# Patient Record
Sex: Female | Born: 1995 | Hispanic: Yes | Marital: Single | Smoking: Never smoker
Health system: Southern US, Community
[De-identification: ages and names within clinical notes are randomized; demographics above are authoritative.]

## PROBLEM LIST (undated history)

## (undated) ENCOUNTER — Inpatient Hospital Stay (HOSPITAL_COMMUNITY): Payer: Self-pay

## (undated) DIAGNOSIS — Z789 Other specified health status: Secondary | ICD-10-CM

## (undated) HISTORY — PX: NO PAST SURGERIES: SHX2092

## (undated) HISTORY — DX: Other specified health status: Z78.9

---

## 2014-01-27 NOTE — L&D Delivery Note (Signed)
   Delivery Note At 9:05 PM a viable and healthy female was delivered via Vaginal, Spontaneous Delivery (Presentation:Right Occiput Anterior ).  APGAR:8 ,9 ; weight pending  .   Placenta status: Intact, Spontaneous.  Cord: 3 vessels with the following complications: none.  Cord pH: N/A  Anesthesia:  None Episiotomy:  None Lacerations:  1st degree peritoneal ---hemostatic and not requiring repair Suture Repair: none Est. Blood Loss (mL):  Refer to delivery summary   Mom to postpartum.  Baby to Couplet care / Skin to Skin.  De Hollingshead 09/09/2014, 9:21 PM

## 2014-03-05 ENCOUNTER — Inpatient Hospital Stay (HOSPITAL_COMMUNITY): Payer: Medicaid Other

## 2014-03-05 ENCOUNTER — Encounter (HOSPITAL_COMMUNITY): Payer: Self-pay | Admitting: *Deleted

## 2014-03-05 ENCOUNTER — Inpatient Hospital Stay (HOSPITAL_COMMUNITY)
Admission: AD | Admit: 2014-03-05 | Discharge: 2014-03-06 | Disposition: A | Discharge status: Home / Self Care | Payer: Medicaid Other | Source: Ambulatory Visit | Attending: Family Medicine | Admitting: Family Medicine

## 2014-03-05 DIAGNOSIS — R1031 Right lower quadrant pain: Secondary | ICD-10-CM | POA: Diagnosis present

## 2014-03-05 DIAGNOSIS — O0931 Supervision of pregnancy with insufficient antenatal care, first trimester: Secondary | ICD-10-CM | POA: Diagnosis not present

## 2014-03-05 DIAGNOSIS — R102 Pelvic and perineal pain: Secondary | ICD-10-CM

## 2014-03-05 DIAGNOSIS — O9989 Other specified diseases and conditions complicating pregnancy, childbirth and the puerperium: Secondary | ICD-10-CM | POA: Diagnosis not present

## 2014-03-05 DIAGNOSIS — Z3A11 11 weeks gestation of pregnancy: Secondary | ICD-10-CM | POA: Diagnosis not present

## 2014-03-05 DIAGNOSIS — O26899 Other specified pregnancy related conditions, unspecified trimester: Secondary | ICD-10-CM

## 2014-03-05 DIAGNOSIS — N949 Unspecified condition associated with female genital organs and menstrual cycle: Secondary | ICD-10-CM | POA: Insufficient documentation

## 2014-03-05 LAB — URINALYSIS, ROUTINE W REFLEX MICROSCOPIC
Bilirubin Urine: NEGATIVE
Glucose, UA: NEGATIVE mg/dL
Hgb urine dipstick: NEGATIVE
Ketones, ur: NEGATIVE mg/dL
Leukocytes, UA: NEGATIVE
Nitrite: NEGATIVE
PROTEIN: NEGATIVE mg/dL
Specific Gravity, Urine: 1.015 (ref 1.005–1.030)
Urobilinogen, UA: 0.2 mg/dL (ref 0.0–1.0)
pH: 7.5 (ref 5.0–8.0)

## 2014-03-05 LAB — POCT PREGNANCY, URINE: Preg Test, Ur: POSITIVE — AB

## 2014-03-05 LAB — CBC
HCT: 33 % — ABNORMAL LOW (ref 36.0–46.0)
Hemoglobin: 11.4 g/dL — ABNORMAL LOW (ref 12.0–15.0)
MCH: 32.3 pg (ref 26.0–34.0)
MCHC: 34.5 g/dL (ref 30.0–36.0)
MCV: 93.5 fL (ref 78.0–100.0)
Platelets: 186 10*3/uL (ref 150–400)
RBC: 3.53 MIL/uL — ABNORMAL LOW (ref 3.87–5.11)
RDW: 13.3 % (ref 11.5–15.5)
WBC: 9.8 10*3/uL (ref 4.0–10.5)

## 2014-03-05 LAB — OB RESULTS CONSOLE GC/CHLAMYDIA: Gonorrhea: NEGATIVE

## 2014-03-05 NOTE — MAU Provider Note (Signed)
Chief Complaint: No chief complaint on file.  First Provider Initiated Contact with Patient 03/05/14 2257      SUBJECTIVE HPI: Gloria LimboKaren Nunez is a 19 y.o. G2P1 at 5375w1d by LMP who presents with intermittent low abdominal pain, right greater than left times a few days. 5/10 on pain scale. No aggravating or alleviating factors. Also reports mild dysuria. Has not had any prenatal care, ultrasounds or blood work this pregnancy. Does not know where she will get prenatal care.   History reviewed. No pertinent past medical history. OB History  Gravida Para Term Preterm AB SAB TAB Ectopic Multiple Living  2 1        1     # Outcome Date GA Lbr Len/2nd Weight Sex Delivery Anes PTL Lv  2 Current           1 Para              History reviewed. No pertinent past surgical history. History   Social History  . Marital Status: Single    Spouse Name: N/A    Number of Children: N/A  . Years of Education: N/A   Occupational History  . Not on file.   Social History Main Topics  . Smoking status: Never Smoker   . Smokeless tobacco: Never Used  . Alcohol Use: No  . Drug Use: No  . Sexual Activity: Yes    Birth Control/ Protection: None   Other Topics Concern  . Not on file   Social History Narrative  . No narrative on file   No current facility-administered medications on file prior to encounter.   No current outpatient prescriptions on file prior to encounter.   No Known Allergies  ROS: Pertinent positive items in HPI. Negative for fever, chills, vaginal bleeding, vaginal discharge, hematuria, urgency, frequency, flank pain, nausea, vomiting, diarrhea, constipation. Last bowel movement yesterday.  OBJECTIVE Blood pressure 108/62, pulse 89, temperature 98.7 F (37.1 C), temperature source Oral, resp. rate 18, height 5\' 2"  (1.575 m), weight 47.628 kg (105 lb), last menstrual period 12/17/2013, SpO2 100 %. GENERAL: Well-developed, well-nourished female in no acute distress.   HEENT: Normocephalic HEART: normal rate RESP: normal effort ABDOMEN: Moderately distended, mild suprapubic and right groin tenderness. Negative rebound tenderness or guarding. Positive bowel sounds 4. Mild, questionable CVA tenderness on the right. No CVA tenderness on left. EXTREMITIES: Nontender, no edema NEURO: Alert and oriented SPECULUM EXAM: NEFG, moderate amount of thin, white, malodorous discharge, no blood noted, cervix clean BIMANUAL: cervix closed; uterus 10-11 week, no adnexal tenderness or masses. No cervical motion tenderness.  LAB RESULTS Results for orders placed or performed during the hospital encounter of 03/05/14 (from the past 24 hour(s))  Urinalysis, Routine w reflex microscopic     Status: None   Collection Time: 03/05/14  9:45 PM  Result Value Ref Range   Color, Urine YELLOW YELLOW   APPearance CLEAR CLEAR   Specific Gravity, Urine 1.015 1.005 - 1.030   pH 7.5 5.0 - 8.0   Glucose, UA NEGATIVE NEGATIVE mg/dL   Hgb urine dipstick NEGATIVE NEGATIVE   Bilirubin Urine NEGATIVE NEGATIVE   Ketones, ur NEGATIVE NEGATIVE mg/dL   Protein, ur NEGATIVE NEGATIVE mg/dL   Urobilinogen, UA 0.2 0.0 - 1.0 mg/dL   Nitrite NEGATIVE NEGATIVE   Leukocytes, UA NEGATIVE NEGATIVE  Pregnancy, urine POC     Status: Abnormal   Collection Time: 03/05/14 10:01 PM  Result Value Ref Range   Preg Test, Ur POSITIVE (A) NEGATIVE  CBC  Status: Abnormal   Collection Time: 03/05/14 11:03 PM  Result Value Ref Range   WBC 9.8 4.0 - 10.5 K/uL   RBC 3.53 (L) 3.87 - 5.11 MIL/uL   Hemoglobin 11.4 (L) 12.0 - 15.0 g/dL   HCT 16.1 (L) 09.6 - 04.5 %   MCV 93.5 78.0 - 100.0 fL   MCH 32.3 26.0 - 34.0 pg   MCHC 34.5 30.0 - 36.0 g/dL   RDW 40.9 81.1 - 91.4 %   Platelets 186 150 - 400 K/uL  Wet prep, genital     Status: Abnormal   Collection Time: 03/06/14 12:30 AM  Result Value Ref Range   Yeast Wet Prep HPF POC NONE SEEN NONE SEEN   Trich, Wet Prep NONE SEEN NONE SEEN   Clue Cells Wet Prep  HPF POC NONE SEEN NONE SEEN   WBC, Wet Prep HPF POC MODERATE (A) NONE SEEN    IMAGING US Ob Comp Less 14 Wks  03/06/2014   CLINICAL DATA:  Right lower quadrant pain since 03/04/2014. Estimated gestational age by LMP is 11 weeks 1 day. Quantitative beta HCG was not ordered.  EXAM: OBSTETRIC <14 WK ULTRASOUND  TECHNIQUE: Transabdominal ultrasound was performed for evaluation of the gestation as well as the maternal uterus and adnexal regions.  COMPARISON:  None.  FINDINGS: Intrauterine gestational sac: A single intrauterine pregnancy is identified.  Yolk sac:  Yolk sac is not present, consistent with gestational age.  Embryo:  Fetal pole is identified.  Cardiac Activity: Fetal cardiac activity is observed.  Heart Rate: 171 bpm  CRL:   46  mm   11 w 3 d                  [place] EDC: 09/21/2014  Maternal uterus/adnexae: No myometrial mass lesions. No subchorionic hemorrhage. Both ovaries are identified and appear normal. No free pelvic fluid collections.  IMPRESSION: Single living intrauterine pregnancy. Estimated gestational age by crown-rump length is 11 weeks 3 days. No acute complication is suggested.   Electronically Signed   By: Burman Nieves M.D.   On: 03/06/2014 00:04    MAU COURSE Low suspicion for appendicitis due to location of pain and absence of fever, leukocytosis and GI complaints.   ASSESSMENT 1. Pain of round ligament affecting pregnancy, antepartum   2. RLQ abdominal pain    PLAN Discharge home in stable condition. Comfort measures. GC/Chlamydia cultures pending.  list of providers given. Pregnancy verification letter given   Medication List    TAKE these medications        ibuprofen 600 MG tablet  Commonly known as:  ADVIL,MOTRIN  Take 1 tablet (600 mg total) by mouth every 6 (six) hours as needed for moderate pain. Do not use after [redacted] weeks gestation.     prenatal multivitamin Tabs tablet  Take 1 tablet by mouth daily at 12 noon.       [place], CNM 03/06/2014   1:04 AM

## 2014-03-05 NOTE — MAU Note (Signed)
Pt reports she is [redacted] weeks pregnant. Having lower abd pain. Dysuria at times. Denies bleeding.

## 2014-03-05 NOTE — Progress Notes (Signed)
Assisted admissions with interpretation of registering patient.  Spanish Interpreter

## 2014-03-05 NOTE — Progress Notes (Signed)
Assisted RN with interpretation of patient assessment. Also assisted Pharmacy Tech with interpretation of medication information.  Spanish Interpreter

## 2014-03-05 NOTE — Progress Notes (Signed)
Assisted RN with interpretation of triage patient information.  Spanish Interpreter

## 2014-03-06 DIAGNOSIS — Z3A11 11 weeks gestation of pregnancy: Secondary | ICD-10-CM

## 2014-03-06 DIAGNOSIS — N949 Unspecified condition associated with female genital organs and menstrual cycle: Secondary | ICD-10-CM

## 2014-03-06 DIAGNOSIS — O9989 Other specified diseases and conditions complicating pregnancy, childbirth and the puerperium: Secondary | ICD-10-CM

## 2014-03-06 LAB — WET PREP, GENITAL
CLUE CELLS WET PREP: NONE SEEN
Trich, Wet Prep: NONE SEEN
YEAST WET PREP: NONE SEEN

## 2014-03-06 LAB — GC/CHLAMYDIA PROBE AMP (~~LOC~~) NOT AT ARMC
CHLAMYDIA, DNA PROBE: NEGATIVE
Neisseria Gonorrhea: NEGATIVE

## 2014-03-06 MED ORDER — IBUPROFEN 600 MG PO TABS: 600.0000 mg | ORAL_TABLET | Freq: Four times a day (QID) | ORAL | Status: DC | PRN

## 2014-03-06 NOTE — Discharge Instructions (Signed)
Dolor abdominal en el embarazo °(Abdominal Pain During Pregnancy) °El dolor abdominal es frecuente durante el embarazo. Generalmente no causa ningún daño. El dolor abdominal puede tener numerosas causas. Algunas causas son más graves que otras. Ciertas causas de dolor abdominal durante el embarazo se diagnostican fácilmente. A veces, se tarda un tiempo para llegar al diagnóstico. Otras veces la causa no se conoce. El dolor abdominal puede estar relacionado con alguna alteración del embarazo, o puede deberse a una causa totalmente diferente. Por este motivo, siempre consulte a su médico cuando sienta molestias abdominales. °INSTRUCCIONES PARA EL CUIDADO EN EL HOGAR  °Esté atenta al dolor para ver si hay cambios. Las siguientes indicaciones ayudarán a aliviar cualquier molestia que pueda sentir: °· No tenga relaciones sexuales y no coloque nada dentro de la vagina hasta que los síntomas hayan desaparecido completamente. °· Descanse todo lo que pueda hasta que el dolor se le haya calmado. °· Si siente náuseas, beba líquidos claros. Evite los alimentos sólidos mientras sienta malestar o tenga náuseas. °· Tome sólo medicamentos de venta libre o recetados, según las indicaciones del médico. °· Cumpla con todas las visitas de control, según le indique su médico. °SOLICITE ATENCIÓN MÉDICA DE INMEDIATO SI: °· Tiene un sangrado, pérdida de líquidos o elimina tejidos por la vagina. °· El dolor o los cólicos aumentan. °· Tiene vómitos persistentes. °· Comienza a sentir dolor al orinar u observa sangre. °· Tiene fiebre. °· Nota que los movimientos del bebé disminuyen. °· Siente intensa debilidad o se marea. °· Tiene dificultad para respirar con o sin dolor abdominal. °· Siente un dolor de cabeza intenso junto al dolor abdominal. °· Tiene una secreción vaginal anormal con dolor abdominal. °· Tiene diarrea persistente. °· El dolor abdominal sigue o empeora aún después de hacer reposo. °ASEGÚRESE DE QUE:  °· Comprende estas  instrucciones. °· Controlará su afección. °· Recibirá ayuda de inmediato si no mejora o si empeora. °Document Released: 01/13/2005 Document Revised: 11/03/2012 °ExitCare® Patient Information ©2015 [employer]. This information is not intended to replace advice given to you by your health care provider. Make sure you discuss any questions you have with your health care provider. ° °

## 2014-03-31 ENCOUNTER — Ambulatory Visit (INDEPENDENT_AMBULATORY_CARE_PROVIDER_SITE_OTHER): Payer: Medicaid Other | Admitting: Physician Assistant

## 2014-03-31 ENCOUNTER — Encounter: Payer: Self-pay | Admitting: Physician Assistant

## 2014-03-31 VITALS — BP 100/50 | HR 88 | Wt 103.9 lb

## 2014-03-31 DIAGNOSIS — Z789 Other specified health status: Secondary | ICD-10-CM

## 2014-03-31 DIAGNOSIS — Z3482 Encounter for supervision of other normal pregnancy, second trimester: Secondary | ICD-10-CM

## 2014-03-31 DIAGNOSIS — Z3492 Encounter for supervision of normal pregnancy, unspecified, second trimester: Secondary | ICD-10-CM

## 2014-03-31 LAB — POCT URINALYSIS DIP (DEVICE)
Bilirubin Urine: NEGATIVE
Glucose, UA: NEGATIVE mg/dL
HGB URINE DIPSTICK: NEGATIVE
Ketones, ur: NEGATIVE mg/dL
Nitrite: NEGATIVE
PH: 6 (ref 5.0–8.0)
Protein, ur: NEGATIVE mg/dL
Specific Gravity, Urine: 1.025 (ref 1.005–1.030)
Urobilinogen, UA: 1 mg/dL (ref 0.0–1.0)

## 2014-03-31 NOTE — Patient Instructions (Signed)
Segundo trimestre de embarazo (Second Trimester of Pregnancy) El segundo trimestre va desde la semana13 hasta la 28, desde el cuarto hasta el sexto mes, y suele ser el momento en el que mejor se siente. Su organismo se ha adaptado a estar embarazada y comienza a sentirse fsicamente mejor. En general, las nuseas matutinas han disminuido o han desaparecido completamente, p El segundo trimestre es tambin la poca en la que el feto se desarrolla rpidamente. Hacia el final del sexto mes, el feto mide aproximadamente 9pulgadas (23cm) y pesa alrededor de 1 libras (700g). Es probable que sienta que el beb se mueve (da pataditas) entre las 18 y 20semanas del embarazo. CAMBIOS EN EL ORGANISMO Su organismo atraviesa por muchos cambios durante el embarazo, y estos varan de una mujer a otra.   Seguir aumentando de peso. Notar que la parte baja del abdomen sobresale.  Podrn aparecer las primeras estras en las caderas, el abdomen y las mamas.  Es posible que tenga dolores de cabeza que pueden aliviarse con los medicamentos que su mdico autorice.  Tal vez tenga necesidad de orinar con ms frecuencia porque el feto est ejerciendo presin sobre la vejiga.  Debido al embarazo podr sentir acidez estomacal con frecuencia.  Puede estar estreida, ya que ciertas hormonas enlentecen los movimientos de los msculos que empujan los desechos a travs de los intestinos.  Pueden aparecer hemorroides o abultarse e hincharse las venas (venas varicosas).  Puede tener dolor de espalda que se debe al aumento de peso y a que las hormonas del embarazo relajan las articulaciones entre los huesos de la pelvis, y como consecuencia de la modificacin del peso y los msculos que mantienen el equilibrio.  Las mamas seguirn creciendo y le dolern.  Las encas pueden sangrar y estar sensibles al cepillado y al hilo dental.  Pueden aparecer zonas oscuras o manchas (cloasma, mscara del embarazo) en el rostro que  probablemente se atenuarn despus del nacimiento del beb.  Es posible que se forme una lnea oscura desde el ombligo hasta la zona del pubis (linea nigra) que probablemente se atenuarn despus del nacimiento del beb.  Tal vez haya cambios en el cabello que pueden incluir su engrosamiento, crecimiento rpido y cambios en la textura. Adems, a algunas mujeres se les cae el cabello durante o despus del embarazo, o tienen el cabello seco o fino. Lo ms probable es que el cabello se le normalice despus del nacimiento del beb. QU DEBE ESPERAR EN LAS CONSULTAS PRENATALES Durante una visita prenatal de rutina:  La pesarn para asegurarse de que usted y el feto estn creciendo normalmente.  Le tomarn la presin arterial.  Le medirn el abdomen para controlar el desarrollo del beb.  Se escucharn los latidos cardacos fetales.  Se evaluarn los resultados de los estudios solicitados en visitas anteriores. El mdico puede preguntarle lo siguiente:  Cmo se siente.  Si siente los movimientos del beb.  Si ha tenido sntomas anormales, como prdida de lquido, sangrado, dolores de cabeza intensos o clicos abdominales.  Si tiene alguna pregunta. Otros estudios que podrn realizarse durante el segundo trimestre incluyen lo siguiente:  Anlisis de sangre para detectar:  Concentraciones de hierro bajas (anemia).  Diabetes gestacional (entre la semana 24 y la 28).  Anticuerpos Rh.  Anlisis de orina para detectar infecciones, diabetes o protenas en la orina.  Una ecografa para confirmar que el beb crece y se desarrolla correctamente.  Una amniocentesis para diagnosticar posibles problemas genticos.  Estudios del feto para descartar espina   bfida y sndrome de Down. INSTRUCCIONES PARA EL CUIDADO EN EL HOGAR   Evite fumar, consumir hierbas, beber alcohol y tomar frmacos que no le hayan recetado. Estas sustancias qumicas afectan la formacin y el desarrollo del beb.  Siga  las indicaciones del mdico en relacin con el uso de medicamentos. Durante el embarazo, hay medicamentos que son seguros de tomar y otros que no.  Haga actividad fsica solo en la forma indicada por el mdico. Sentir clicos uterinos es un buen signo para detener la actividad fsica.  Contine comiendo alimentos que sanos con regularidad.  Use un sostn que le brinde buen soporte si le duelen las mamas.  No se d baos de inmersin en agua caliente, baos turcos ni saunas.  Colquese el cinturn de seguridad cuando conduzca.  No coma carne cruda ni queso sin cocinar; evite el contacto con las bandejas sanitarias de los gatos y la tierra que estos animales usan. Estos elementos contienen grmenes que pueden causar defectos congnitos en el beb.  Tome las vitaminas prenatales.  Si est estreida, pruebe un laxante suave (si el mdico lo autoriza). Consuma ms alimentos ricos en fibra, como vegetales y frutas frescos y cereales integrales. Beba gran cantidad de lquido para mantener la orina de tono claro o color amarillo plido.  Dese baos de asiento con agua tibia para aliviar el dolor o las molestias causadas por las hemorroides. Use una crema para las hemorroides si el mdico la autoriza.  Si tiene venas varicosas, use medias de descanso. Eleve los pies durante 15minutos, 3 o 4veces por da. Limite la cantidad de sal en su dieta.  No levante objetos pesados, use zapatos de tacones bajos y mantenga una buena postura.  Descanse con las piernas elevadas si tiene calambres o dolor de cintura.  Visite a su dentista si an no lo ha hecho durante el embarazo. Use un cepillo de dientes blando para higienizarse los dientes y psese el hilo dental con suavidad.  Puede seguir manteniendo relaciones sexuales, a menos que el mdico le indique lo contrario.  Concurra a todas las visitas prenatales segn las indicaciones de su mdico. SOLICITE ATENCIN MDICA SI:   Tiene mareos.  Siente  clicos leves, presin en la pelvis o dolor persistente en el abdomen.  Tiene nuseas, vmitos o diarrea persistentes.  Tiene secrecin vaginal con mal olor.  Siente dolor al orinar. SOLICITE ATENCIN MDICA DE INMEDIATO SI:   Tiene fiebre.  Tiene una prdida de lquido por la vagina.  Tiene sangrado o pequeas prdidas vaginales.  Siente dolor intenso o clicos en el abdomen.  Sube o baja de peso rpidamente.  Tiene dificultad para respirar y siente dolor de pecho.  Sbitamente se le hinchan mucho el rostro, las manos, los tobillos, los pies o las piernas.  No ha sentido los movimientos del beb durante una hora.  Siente un dolor de cabeza intenso que no se alivia con medicamentos.  Hay cambios en la visin. Document Released: 10/23/2004 Document Revised: 01/18/2013 ExitCare Patient Information 2015 [employer]. This information is not intended to replace advice given to you by your health care provider. Make sure you discuss any questions you have with your health care provider.  

## 2014-03-31 NOTE — Progress Notes (Signed)
  Subjective:    Gloria Nunez is a [id number] being seen today for her first obstetrical visit.   Patient does intend to breast feed. Pregnancy history fully reviewed.  Patient reports no complaints.  Filed Vitals:   03/31/14 0848  BP: 100/50  Pulse: 88  Weight: 103 lb 14.4 oz (47.129 kg)    HISTORY: OB History  Gravida Para Term Preterm AB SAB TAB Ectopic Multiple Living  2 1 1       2     # Outcome Date GA Lbr Len/2nd Weight Sex Delivery Anes PTL Lv  2 Current           1 Term 12/03/12 5364w0d   M Vag-Spont   Y     Past Medical History  Diagnosis Date  . Medical history non-contributory    Past Surgical History  Procedure Laterality Date  . No past surgeries     Family History  Problem Relation Age of Onset  . Diabetes Maternal Grandfather   . Heart disease Maternal Grandfather      Exam    Uterus:  Fundal Height: 12 cm  Pelvic Exam:                               System:     Skin: normal coloration and turgor, no rashes    Neurologic: oriented, normal mood, grossly non-focal   Extremities: normal strength, tone, and muscle mass   HEENT extra ocular movement intact   Mouth/Teeth mucous membranes moist, pharynx normal without lesions    Neck supple   Cardiovascular: regular rate and rhythm   Respiratory:  appears well, vitals normal, no respiratory distress, acyanotic, normal RR, ear and throat exam is normal, neck free of mass or lymphadenopathy, chest clear, no wheezing, crepitations, rhonchi, normal symmetric air entry   Abdomen: soft, non-tender; bowel sounds normal; no masses,  no organomegaly   Urinary:       Assessment:    Pregnancy: [id number] Patient Active Problem List   Diagnosis Date Noted  . Encounter for supervision of other normal pregnancy in second trimester 03/31/2014  . Language barrier 03/31/2014        Plan:     Initial labs drawn. Prenatal vitamins. Problem list reviewed and updated. Genetic Screening to be  offered at next visit  Ultrasound discussed; fetal survey: ordered.  Follow up in 4 weeks. 90% of 45 min visit spent on counseling and coordination of care.     Bertram Denvereague Clark, Gloria Nunez 03/31/2014

## 2014-04-01 LAB — PRESCRIPTION MONITORING PROFILE (19 PANEL)
Amphetamine/Meth: NEGATIVE ng/mL
Barbiturate Screen, Urine: NEGATIVE ng/mL
Benzodiazepine Screen, Urine: NEGATIVE ng/mL
Buprenorphine, Urine: NEGATIVE ng/mL
Cannabinoid Scrn, Ur: NEGATIVE ng/mL
Carisoprodol, Urine: NEGATIVE ng/mL
Cocaine Metabolites: NEGATIVE ng/mL
Creatinine, Urine: 179.35 mg/dL (ref >20.0)
Fentanyl, Ur: NEGATIVE ng/mL
MDMA URINE: NEGATIVE ng/mL
METHADONE SCREEN, URINE: NEGATIVE ng/mL
Meperidine, Ur: NEGATIVE ng/mL
Methaqualone: NEGATIVE ng/mL
NITRITES URINE, INITIAL: NEGATIVE ug/mL
Opiate Screen, Urine: NEGATIVE ng/mL
Oxycodone Screen, Ur: NEGATIVE ng/mL
PHENCYCLIDINE, UR: NEGATIVE ng/mL
PROPOXYPHENE: NEGATIVE ng/mL
TAPENTADOLUR: NEGATIVE ng/mL
TRAMADOL UR: NEGATIVE ng/mL
Zolpidem, Urine: NEGATIVE ng/mL
pH, Initial: 6 pH (ref 4.5–8.9)

## 2014-04-02 LAB — CULTURE, OB URINE
COLONY COUNT: NO GROWTH
Organism ID, Bacteria: NO GROWTH

## 2014-04-03 LAB — PRENATAL PROFILE (SOLSTAS)
Antibody Screen: NEGATIVE
BASOS ABS: 0 10*3/uL (ref 0.0–0.1)
Basophils Relative: 0 % (ref 0–1)
Eosinophils Absolute: 0.1 10*3/uL (ref 0.0–0.7)
Eosinophils Relative: 1 % (ref 0–5)
HCT: 34.7 % — ABNORMAL LOW (ref 36.0–46.0)
HIV 1&2 Ab, 4th Generation: NONREACTIVE
Hemoglobin: 11.7 g/dL — ABNORMAL LOW (ref 12.0–15.0)
Hepatitis B Surface Ag: NEGATIVE
LYMPHS PCT: 18 % (ref 12–46)
Lymphs Abs: 1.2 10*3/uL (ref 0.7–4.0)
MCH: 31.9 pg (ref 26.0–34.0)
MCHC: 33.7 g/dL (ref 30.0–36.0)
MCV: 94.6 fL (ref 78.0–100.0)
MPV: 10.7 fL (ref 8.6–12.4)
Monocytes Absolute: 0.3 10*3/uL (ref 0.1–1.0)
Monocytes Relative: 4 % (ref 3–12)
NEUTROS ABS: 5 10*3/uL (ref 1.7–7.7)
Neutrophils Relative %: 77 % (ref 43–77)
Platelets: 226 10*3/uL (ref 150–400)
RBC: 3.67 MIL/uL — AB (ref 3.87–5.11)
RDW: 13.7 % (ref 11.5–15.5)
Rh Type: POSITIVE
Rubella: 1.37 Index — ABNORMAL HIGH (ref <0.90)
WBC: 6.5 10*3/uL (ref 4.0–10.5)

## 2014-04-28 ENCOUNTER — Encounter: Payer: Medicaid Other | Admitting: Physician Assistant

## 2014-05-03 ENCOUNTER — Ambulatory Visit (INDEPENDENT_AMBULATORY_CARE_PROVIDER_SITE_OTHER): Payer: Medicaid Other | Admitting: Physician Assistant

## 2014-05-03 ENCOUNTER — Encounter: Payer: Self-pay | Admitting: Physician Assistant

## 2014-05-03 VITALS — BP 108/58 | HR 75 | Temp 98.1°F | Wt 110.0 lb

## 2014-05-03 DIAGNOSIS — Z3482 Encounter for supervision of other normal pregnancy, second trimester: Secondary | ICD-10-CM

## 2014-05-03 LAB — POCT URINALYSIS DIP (DEVICE)
Bilirubin Urine: NEGATIVE
GLUCOSE, UA: NEGATIVE mg/dL
HGB URINE DIPSTICK: NEGATIVE
KETONES UR: NEGATIVE mg/dL
Nitrite: NEGATIVE
PH: 7.5 (ref 5.0–8.0)
Protein, ur: NEGATIVE mg/dL
Specific Gravity, Urine: 1.015 (ref 1.005–1.030)
Urobilinogen, UA: 1 mg/dL (ref 0.0–1.0)

## 2014-05-03 NOTE — Progress Notes (Signed)
C/o intermittent pelvic cramping.  C/o white vaginal discharge causing some vaginal itching-- wet prep today.

## 2014-05-03 NOTE — Progress Notes (Signed)
19 weeks, stable.  Endorses good FM.  Denies VB, LOF, dysuria.  Does have some stretching pains on right side lower abdomen Has been using OTC yeast cream.  Okay to continue.   Desires Quad today.   Has appt for u/s on 4/8.  RTC 4 weeks

## 2014-05-03 NOTE — Patient Instructions (Signed)
Segundo trimestre de embarazo (Second Trimester of Pregnancy) El segundo trimestre va desde la semana13 hasta la 28, desde el cuarto hasta el sexto mes, y suele ser el momento en el que mejor se siente. Su organismo se ha adaptado a estar embarazada y comienza a sentirse fsicamente mejor. En general, las nuseas matutinas han disminuido o han desaparecido completamente, p El segundo trimestre es tambin la poca en la que el feto se desarrolla rpidamente. Hacia el final del sexto mes, el feto mide aproximadamente 9pulgadas (23cm) y pesa alrededor de 1 libras (700g). Es probable que sienta que el beb se mueve (da pataditas) entre las 18 y 20semanas del embarazo. CAMBIOS EN EL ORGANISMO Su organismo atraviesa por muchos cambios durante el embarazo, y estos varan de una mujer a otra.   Seguir aumentando de peso. Notar que la parte baja del abdomen sobresale.  Podrn aparecer las primeras estras en las caderas, el abdomen y las mamas.  Es posible que tenga dolores de cabeza que pueden aliviarse con los medicamentos que su mdico autorice.  Tal vez tenga necesidad de orinar con ms frecuencia porque el feto est ejerciendo presin sobre la vejiga.  Debido al embarazo podr sentir acidez estomacal con frecuencia.  Puede estar estreida, ya que ciertas hormonas enlentecen los movimientos de los msculos que empujan los desechos a travs de los intestinos.  Pueden aparecer hemorroides o abultarse e hincharse las venas (venas varicosas).  Puede tener dolor de espalda que se debe al aumento de peso y a que las hormonas del embarazo relajan las articulaciones entre los huesos de la pelvis, y como consecuencia de la modificacin del peso y los msculos que mantienen el equilibrio.  Las mamas seguirn creciendo y le dolern.  Las encas pueden sangrar y estar sensibles al cepillado y al hilo dental.  Pueden aparecer zonas oscuras o manchas (cloasma, mscara del embarazo) en el rostro que  probablemente se atenuarn despus del nacimiento del beb.  Es posible que se forme una lnea oscura desde el ombligo hasta la zona del pubis (linea nigra) que probablemente se atenuarn despus del nacimiento del beb.  Tal vez haya cambios en el cabello que pueden incluir su engrosamiento, crecimiento rpido y cambios en la textura. Adems, a algunas mujeres se les cae el cabello durante o despus del embarazo, o tienen el cabello seco o fino. Lo ms probable es que el cabello se le normalice despus del nacimiento del beb. QU DEBE ESPERAR EN LAS CONSULTAS PRENATALES Durante una visita prenatal de rutina:  La pesarn para asegurarse de que usted y el feto estn creciendo normalmente.  Le tomarn la presin arterial.  Le medirn el abdomen para controlar el desarrollo del beb.  Se escucharn los latidos cardacos fetales.  Se evaluarn los resultados de los estudios solicitados en visitas anteriores. El mdico puede preguntarle lo siguiente:  Cmo se siente.  Si siente los movimientos del beb.  Si ha tenido sntomas anormales, como prdida de lquido, sangrado, dolores de cabeza intensos o clicos abdominales.  Si tiene alguna pregunta. Otros estudios que podrn realizarse durante el segundo trimestre incluyen lo siguiente:  Anlisis de sangre para detectar:  Concentraciones de hierro bajas (anemia).  Diabetes gestacional (entre la semana 24 y la 28).  Anticuerpos Rh.  Anlisis de orina para detectar infecciones, diabetes o protenas en la orina.  Una ecografa para confirmar que el beb crece y se desarrolla correctamente.  Una amniocentesis para diagnosticar posibles problemas genticos.  Estudios del feto para descartar espina   bfida y sndrome de Down. INSTRUCCIONES PARA EL CUIDADO EN EL HOGAR   Evite fumar, consumir hierbas, beber alcohol y tomar frmacos que no le hayan recetado. Estas sustancias qumicas afectan la formacin y el desarrollo del beb.  Siga  las indicaciones del mdico en relacin con el uso de medicamentos. Durante el embarazo, hay medicamentos que son seguros de tomar y otros que no.  Haga actividad fsica solo en la forma indicada por el mdico. Sentir clicos uterinos es un buen signo para detener la actividad fsica.  Contine comiendo alimentos que sanos con regularidad.  Use un sostn que le brinde buen soporte si le duelen las mamas.  No se d baos de inmersin en agua caliente, baos turcos ni saunas.  Colquese el cinturn de seguridad cuando conduzca.  No coma carne cruda ni queso sin cocinar; evite el contacto con las bandejas sanitarias de los gatos y la tierra que estos animales usan. Estos elementos contienen grmenes que pueden causar defectos congnitos en el beb.  Tome las vitaminas prenatales.  Si est estreida, pruebe un laxante suave (si el mdico lo autoriza). Consuma ms alimentos ricos en fibra, como vegetales y frutas frescos y cereales integrales. Beba gran cantidad de lquido para mantener la orina de tono claro o color amarillo plido.  Dese baos de asiento con agua tibia para aliviar el dolor o las molestias causadas por las hemorroides. Use una crema para las hemorroides si el mdico la autoriza.  Si tiene venas varicosas, use medias de descanso. Eleve los pies durante 15minutos, 3 o 4veces por da. Limite la cantidad de sal en su dieta.  No levante objetos pesados, use zapatos de tacones bajos y mantenga una buena postura.  Descanse con las piernas elevadas si tiene calambres o dolor de cintura.  Visite a su dentista si an no lo ha hecho durante el embarazo. Use un cepillo de dientes blando para higienizarse los dientes y psese el hilo dental con suavidad.  Puede seguir manteniendo relaciones sexuales, a menos que el mdico le indique lo contrario.  Concurra a todas las visitas prenatales segn las indicaciones de su mdico. SOLICITE ATENCIN MDICA SI:   Tiene mareos.  Siente  clicos leves, presin en la pelvis o dolor persistente en el abdomen.  Tiene nuseas, vmitos o diarrea persistentes.  Tiene secrecin vaginal con mal olor.  Siente dolor al orinar. SOLICITE ATENCIN MDICA DE INMEDIATO SI:   Tiene fiebre.  Tiene una prdida de lquido por la vagina.  Tiene sangrado o pequeas prdidas vaginales.  Siente dolor intenso o clicos en el abdomen.  Sube o baja de peso rpidamente.  Tiene dificultad para respirar y siente dolor de pecho.  Sbitamente se le hinchan mucho el rostro, las manos, los tobillos, los pies o las piernas.  No ha sentido los movimientos del beb durante una hora.  Siente un dolor de cabeza intenso que no se alivia con medicamentos.  Hay cambios en la visin. Document Released: 10/23/2004 Document Revised: 01/18/2013 ExitCare Patient Information 2015 [employer]. This information is not intended to replace advice given to you by your health care provider. Make sure you discuss any questions you have with your health care provider.  

## 2014-05-05 ENCOUNTER — Ambulatory Visit (HOSPITAL_COMMUNITY): Payer: Medicaid Other

## 2014-05-05 ENCOUNTER — Other Ambulatory Visit: Payer: Self-pay | Admitting: Physician Assistant

## 2014-05-05 ENCOUNTER — Ambulatory Visit (HOSPITAL_COMMUNITY)
Admission: RE | Admit: 2014-05-05 | Discharge: 2014-05-05 | Disposition: A | Discharge status: Home / Self Care | Payer: Medicaid Other | Source: Ambulatory Visit | Attending: Physician Assistant | Admitting: Physician Assistant

## 2014-05-05 DIAGNOSIS — Z3482 Encounter for supervision of other normal pregnancy, second trimester: Secondary | ICD-10-CM | POA: Diagnosis present

## 2014-05-05 DIAGNOSIS — IMO0002 Reserved for concepts with insufficient information to code with codable children: Secondary | ICD-10-CM | POA: Insufficient documentation

## 2014-05-05 DIAGNOSIS — Z3492 Encounter for supervision of normal pregnancy, unspecified, second trimester: Secondary | ICD-10-CM

## 2014-05-05 DIAGNOSIS — Z3689 Encounter for other specified antenatal screening: Secondary | ICD-10-CM | POA: Insufficient documentation

## 2014-05-05 DIAGNOSIS — O350XX0 Maternal care for (suspected) central nervous system malformation in fetus, not applicable or unspecified: Secondary | ICD-10-CM | POA: Insufficient documentation

## 2014-05-05 DIAGNOSIS — Z3A19 19 weeks gestation of pregnancy: Secondary | ICD-10-CM | POA: Insufficient documentation

## 2014-05-08 LAB — AFP, QUAD SCREEN
AFP: 62.8 ng/mL
CURR GEST AGE: 19.6 wks.days
HCG, Total: 7.33 IU/mL
INH: 132.3 pg/mL
Interpretation-AFP: NEGATIVE
MOM FOR HCG: 0.28
MoM for AFP: 0.97
MoM for INH: 0.63
OPEN SPINA BIFIDA: NEGATIVE
Osb Risk: 1:15000 {titer}
Tri 18 Scr Risk Est: NEGATIVE
Trisomy 18 (Edward) Syndrome Interp.: 1:7230 {titer}
uE3 Mom: 1.36
uE3 Value: 2.58 ng/mL

## 2014-05-31 ENCOUNTER — Encounter: Payer: Medicaid Other | Admitting: Physician Assistant

## 2014-06-07 ENCOUNTER — Encounter: Payer: Self-pay | Admitting: Advanced Practice Midwife

## 2014-06-07 ENCOUNTER — Ambulatory Visit (INDEPENDENT_AMBULATORY_CARE_PROVIDER_SITE_OTHER): Payer: Medicaid Other | Admitting: Advanced Practice Midwife

## 2014-06-07 VITALS — BP 108/60 | HR 67 | Temp 98.1°F

## 2014-06-07 DIAGNOSIS — B373 Candidiasis of vulva and vagina: Secondary | ICD-10-CM

## 2014-06-07 DIAGNOSIS — IMO0002 Reserved for concepts with insufficient information to code with codable children: Secondary | ICD-10-CM | POA: Insufficient documentation

## 2014-06-07 DIAGNOSIS — N898 Other specified noninflammatory disorders of vagina: Secondary | ICD-10-CM

## 2014-06-07 DIAGNOSIS — O26892 Other specified pregnancy related conditions, second trimester: Secondary | ICD-10-CM

## 2014-06-07 DIAGNOSIS — B3731 Acute candidiasis of vulva and vagina: Secondary | ICD-10-CM

## 2014-06-07 DIAGNOSIS — O350XX1 Maternal care for (suspected) central nervous system malformation in fetus, fetus 1: Secondary | ICD-10-CM

## 2014-06-07 DIAGNOSIS — IMO0001 Reserved for inherently not codable concepts without codable children: Secondary | ICD-10-CM

## 2014-06-07 DIAGNOSIS — Z3482 Encounter for supervision of other normal pregnancy, second trimester: Secondary | ICD-10-CM

## 2014-06-07 LAB — POCT URINALYSIS DIP (DEVICE)
Bilirubin Urine: NEGATIVE
GLUCOSE, UA: NEGATIVE mg/dL
Hgb urine dipstick: NEGATIVE
Ketones, ur: NEGATIVE mg/dL
NITRITE: NEGATIVE
Protein, ur: NEGATIVE mg/dL
Specific Gravity, Urine: 1.02 (ref 1.005–1.030)
Urobilinogen, UA: 1 mg/dL (ref 0.0–1.0)
pH: 7 (ref 5.0–8.0)

## 2014-06-07 MED ORDER — TERCONAZOLE 0.4 % VA CREA: 1.0000 | TOPICAL_CREAM | Freq: Every day | VAGINAL | Status: DC

## 2014-06-07 NOTE — Progress Notes (Signed)
C/O leaking thin white discharge x 1 week. Some contractions. Pool, fern both neg. Large amount of white, curd-like D/C mixed w/ thin white D/C. Rx Terazol. PTL precautions. Discussed CPC and neg quad. Used interpreter.

## 2014-06-07 NOTE — Progress Notes (Signed)
Pain on right side    Pressure- stomach   Vaginal discharge- white with itchness at times  Pt reports having clear liquid coming from vagina after having a contraction x 1

## 2014-06-07 NOTE — Patient Instructions (Signed)
Vaginitis monilisica (Monilial Vaginitis) La vaginitis es una inflamacin (irritacin, hinchazn) de la vagina y la vulva. Esta no es una enfermedad de transmisin sexual.  CAUSAS Este tipo de vaginitis lo causa un hongo (candida) que normalmente se encuentra en la vagina. El hongo candida se ha desarrollado hasta el punto de ocasionar problemas en el equilibrio qumico. SNTOMAS  Secrecin vaginal espesa y blanca.  Hinchazn, picazn, enrojecimiento e inflamacin de la vagina y en algunos casos de los labios vaginales (vulva).  Ardor o dolor al ConocoPhillipsorinar.  Dolor en las relaciones sexuales. DIAGNSTICO Los factores que favorecen la vaginitis moniliasica son:  Everlean Pattersontapas de virginidad y postmenopusicas.  Embarazo.  Infecciones.  Sentir cansancio, estar enferma o estresada, especialmente si ya ha sufrido este problema en el pasado.  Diabetes Buen control ayudar a disminur la probabilidad.  Pldoras anticonceptivas  Ropa interior [place] Islandsmuy ajustada.  El uso de espumas de bao, aerosoles femeninos duchas vaginales o tampones con desodorante.  Algunos antibiticos (medicamentos que destruyen grmenes).  Si contrae alguna enfermedad puede sufrir recurrencias espordicas. TRATAMIENTO El profesional que lo asiste prescribir medicamentos.  Hay diferentes tipos de cremas y supositorios vaginales que tratan especficamente la vaginitis monilisica. Para infecciones por hongos recurrentes, utilice un supositorio o crema en la vagina dos veces por semana, o segn se le indique.  Tambin podrn utilizarse cremas con corticoides o anti monilisicas para la picazn o la irritacin de la vulva. Consulte con el profesional que la asiste.  Si la crema no da resultado, podr aplicarse en la vagina una solucin con azul de metileno.  El consumo de yogur puede prevenir este tipo de vaginitis. INSTRUCCIONES PARA EL CUIDADO DOMICILIARIO  Tome todos los medicamentos tal como se le indic.  No  mantenga relaciones sexuales hasta que el tratamiento se haya completado, o segn las indicaciones del profesional que la asiste.  Tome baos de asiento tibios.  No se aplique duchas vaginales.  No utilice tampones, especialmente los perfumados.  Use ropa interior de algodn  MirantEvite los pantalones ajustados y las medias tipo panty.  Comunique a sus compaeros sexuales que sufre una infeccin por hongos. Ellos deben concurrir para un control mdico si tienen sntomas como una urticaria leve o picazn.    Sus compaeros sexuales deben tratarse tambin si la infeccin es difcil de Pharmacologisteliminar.  Practique el sexo seguro - use condones  Algunos medicamentos vaginales ocasionan fallas en los condones de ltex. Los medicamentos vaginales que pueden daar los condones son:  ChiropodistCrema cleocina  Butoconazole (Femstat)  Terconazole (Terazol) supositorios vaginales  Miconazole (Monistat) (es un medicamento de venta libre) SOLICITE ATENCIN MDICA SI:  Daphane ShepherdUsted tiene una temperatura oral de ms de 38,9 C (102 F).  Si la infeccin empeora luego de 2 [address] de tratamiento.  Si la infeccin no mejora luego de 3 [address] de tratamiento.  Aparecen ampollas en o alrededor de la vagina.  Si aparece una hemorragia vaginal y no es el momento del perodo.  Siente dolor al ConocoPhillipsorinar.  Presenta problemas intestinales.  Tiene dolor durante las The St. Paul Travelersrelaciones sexuales. Document Released: 10/23/2004 Document Revised: 04/07/2011 [facility] Patient Information 2015 Foster CenterExitCare, MarylandLLC. This information is not intended to replace advice given to you by your health care provider. Make sure you discuss any questions you have with your health care provider.  Informacin sobre Government social research officerel parto prematuro  (Preterm Labor Information) El parto prematuro comienza antes de la semana 37 de [place]embarazo. La duracin de un embarazo normal es de 39 a 41 semanas.  CAUSAS  Generalmente  no hay una causa que pueda identificarse del motivo por el  que una mujer comienza un trabajo de parto prematuro. Sin embargo, una de las causas conocidas ms frecuentes son las infecciones. Las infecciones del tero, el cuello, la vagina, el lquido McCameyamnitico, la vejiga, los riones y Teacher, adult educationhasta de los pulmones (neumona) pueden hacer que el trabajo de parto se inicie. Otras causas que pueden sospecharse son:  Infecciones urogenitales, como infecciones por hongos y vaginosis bacteriana.  Anormalidades uterinas (forma del tero, sptum uterino, fibromas, hemorragias en la placenta).  Un cuello que ha sido operado (puede ser que no permanezca cerrado).  Malformaciones del feto.  Gestaciones mltiples (mellizos, trillizos y ms).  Ruptura del saco amnitico.  FACTORES DE RIESGO  Historia previa de parto prematuro.  Tener ruptura prematura de las membranas (RPM).  La placenta cubre la abertura del cuello (placenta previa).  La placenta se separa del tero (abrupcin placentaria).  El cuello es demasiado dbil para contener al beb en el tero (cuello incompetente).  Hay mucho lquido en el saco amnitico (polihidramnios).  Consumo de drogas o hbito de fumar durante Firefighterel embarazo.  No aumentar de peso lo suficiente durante el Big Lotsembarazo.  Mujeres menores de 18 aos o mayores de [address] [place] aos.  Nivel socioeconmico bajo.  Pertenecer a Engineer, productionla raza afroamericana. SNTOMAS  Los signos y sntomas del trabajo de parto prematuro son:  Public librarianClicos similares a los Designer, jewellerymenstruales, dolor abdominal o dolor de espalda. Contracciones uterinas regulares, tan frecuentes como seis por hora, sin importar su intensidad (pueden ser suaves o dolorosas). Contracciones que comienzan en la parte superior del tero y se expanden hacia abajo, a la zona inferior del abdomen y la espalda.  Sensacin de aumento de presin en la pelvis.  Aparece una secrecin acuosa o sanguinolenta por la vagina.  TRATAMIENTO  Segn el tiempo del embarazo y otras Pleasant Valleycircunstancias, el mdico puede indicar  reposo en cama. Si es necesario, le indicarn medicamentos para TEFL teacherdetener las contracciones y para Customer service managermadurar los pulmones del feto. Si el trabajo de parto se inicia antes de las 34 semanas de [place]azo, se recomienda la hospitalizacin. El tratamiento depende de las condiciones en que se encuentren usted y el feto.  QU DEBE HACER SI PIENSA QUE EST EN TRABAJO DE PARTO PREMATURO?  Comunquese con su mdico inmediatamente. Debe concurrir al hospital para ser controlada inmediatamente.  CMO PUEDE EVITAR EL TRABAJO DE PARTO PREMATURO EN FUTUROS EMBARAZOS?  Usted debe:  Si fuma, abandonar el hbito. Mantener un peso saludable y evitar sustancias qumicas y drogas innecesarias. Controlar todo tipo de infeccin. Informe a su mdico si tiene una historia conocida de trabajo de parto prematuro. Document Released: 04/22/2007 Document Revised: 09/15/2012 [facility] HospitalExit[facility] Patient Information 2015 [place]onyExitCare, [place]LLC. This information is not intended to replace advice given to you by your health care provider. Make sure you discuss any questions you have with your health care provider.

## 2014-06-08 LAB — WET PREP, GENITAL: TRICH WET PREP: NONE SEEN

## 2014-06-13 ENCOUNTER — Other Ambulatory Visit: Payer: Self-pay | Admitting: Advanced Practice Midwife

## 2014-06-13 DIAGNOSIS — B9689 Other specified bacterial agents as the cause of diseases classified elsewhere: Secondary | ICD-10-CM

## 2014-06-13 DIAGNOSIS — N760 Acute vaginitis: Principal | ICD-10-CM

## 2014-06-13 MED ORDER — METRONIDAZOLE 500 MG PO TABS: 500.0000 mg | ORAL_TABLET | Freq: Two times a day (BID) | ORAL | Status: DC

## 2014-06-13 NOTE — Progress Notes (Signed)
BV and yeast on wet prep. Has Terazol--continue. Rx Flagyl.

## 2014-06-14 ENCOUNTER — Telehealth: Payer: Self-pay

## 2014-06-14 NOTE — Telephone Encounter (Signed)
Attempted to contact patient with spanish interpreter Dori Arias. No answer. Left message stating we are calling with results, please call clinic.  

## 2014-06-14 NOTE — Telephone Encounter (Signed)
-----   Message from AlabamaVirginia Smith, PennsylvaniaRhode IslandCNM sent at 06/13/2014  9:32 AM EDT ----- Inform patient wet prep positive for yeast and BV. Has Terazol and should continue. I Rx'd Flagyl 500 mg PO BID x 7 days.

## 2014-06-15 NOTE — Telephone Encounter (Signed)
Patient informed of results with help of Bettye BoeckDori Arias Spanish interpreter.

## 2014-07-05 ENCOUNTER — Encounter: Payer: Medicaid Other | Admitting: Obstetrics and Gynecology

## 2014-07-07 ENCOUNTER — Ambulatory Visit (INDEPENDENT_AMBULATORY_CARE_PROVIDER_SITE_OTHER): Payer: Medicaid Other | Admitting: Advanced Practice Midwife

## 2014-07-07 VITALS — BP 99/66 | HR 65 | Temp 97.9°F | Wt 117.5 lb

## 2014-07-07 DIAGNOSIS — O4703 False labor before 37 completed weeks of gestation, third trimester: Secondary | ICD-10-CM

## 2014-07-07 DIAGNOSIS — Z3493 Encounter for supervision of normal pregnancy, unspecified, third trimester: Secondary | ICD-10-CM

## 2014-07-07 DIAGNOSIS — Z23 Encounter for immunization: Secondary | ICD-10-CM

## 2014-07-07 DIAGNOSIS — Z3482 Encounter for supervision of other normal pregnancy, second trimester: Secondary | ICD-10-CM

## 2014-07-07 LAB — CBC
HCT: 31.5 % — ABNORMAL LOW (ref 36.0–46.0)
Hemoglobin: 10.7 g/dL — ABNORMAL LOW (ref 12.0–15.0)
MCH: 32.1 pg (ref 26.0–34.0)
MCHC: 34 g/dL (ref 30.0–36.0)
MCV: 94.6 fL (ref 78.0–100.0)
MPV: 10.3 fL (ref 8.6–12.4)
Platelets: 246 10*3/uL (ref 150–400)
RBC: 3.33 MIL/uL — ABNORMAL LOW (ref 3.87–5.11)
RDW: 13.3 % (ref 11.5–15.5)
WBC: 6.6 10*3/uL (ref 4.0–10.5)

## 2014-07-07 LAB — POCT URINALYSIS DIP (DEVICE)
BILIRUBIN URINE: NEGATIVE
GLUCOSE, UA: NEGATIVE mg/dL
Hgb urine dipstick: NEGATIVE
Ketones, ur: NEGATIVE mg/dL
NITRITE: NEGATIVE
Protein, ur: 30 mg/dL — AB
SPECIFIC GRAVITY, URINE: 1.025 (ref 1.005–1.030)
Urobilinogen, UA: 2 mg/dL — ABNORMAL HIGH (ref 0.0–1.0)
pH: 7 (ref 5.0–8.0)

## 2014-07-07 LAB — RPR

## 2014-07-07 MED ORDER — TETANUS-DIPHTH-ACELL PERTUSSIS 5-2.5-18.5 LF-MCG/0.5 IM SUSP
0.5000 mL | Freq: Once | INTRAMUSCULAR | Status: AC
  Administered 2014-07-07: 0.5 mL via INTRAMUSCULAR

## 2014-07-07 NOTE — Progress Notes (Signed)
Breastfeeding tip of the week reviewed 28 wk labs with 1 hour gtt Tdap vaccine Home Medicaid form completed Leukocytes: Large, Urobilinogen 2.0 Pt complains of feeling contractions for the past 3 days, painful contractions yesterday, denies any contractions this am

## 2014-07-07 NOTE — Patient Instructions (Addendum)
Etonogestrel implant Qu es este medicamento? El ETONOGESTREL es un dispositivo anticonceptivo (control de la natalidad). Se utiliza para prevenir el embarazo. Se puede utilizar hasta 3 aos. Este medicamento puede ser utilizado para otros usos; si tiene alguna pregunta consulte con su proveedor de atencin mdica o con su farmacutico. MARCAS COMERCIALES DISPONIBLES: Implanon, Nexplanon Qu le debo informar a mi profesional de la salud antes de tomar este medicamento? Necesita saber si usted presenta alguno de los siguientes problemas o situaciones: -sangrado vaginal anormal -enfermedad vascular o cogulos sanguneos -cncer de mama, cervical, heptico -depresin -diabetes -enfermedad de la vescula biliar -dolores de cabeza -enfermedad cardiaca o ataque cardiaco reciente -alta presin sangunea -alto nivel de colesterol -enfermedad renal -enfermedad heptica -convulsiones -fuma tabaco -una reaccin alrgica o inusual al etonogestrel, otras hormonas, anestsicos o antispticos, medicamentos, alimentos, colorantes o conservantes -si est embarazada o buscando quedar embarazada -si est amamantando a un beb Cmo debo utilizar este medicamento? Este dispositivo se inserta debajo de la piel en la cara interna de la parte superior del brazo por un profesional de la salud. Hable con su pediatra para informarse acerca del uso de este medicamento en nios. Puede requerir atencin especial. Sobredosis: Pngase en contacto inmediatamente con un centro toxicolgico o una sala de urgencia si usted cree que haya tomado demasiado medicamento. ATENCIN: Este medicamento es solo para usted. No comparta este medicamento con nadie. Qu sucede si me olvido de una dosis? No se aplica en este caso. Qu puede interactuar con este medicamento? No tome esta medicina con ninguno de los siguientes medicamentos: -amprenavir -bosentano -fosamprenavir Esta medicina tambin puede interactuar con los  siguientes medicamentos: -medicamentos barbitricos para inducir el sueo o tratar convulsiones -ciertos medicamentos para las infecciones micticas tales como quetoconazol e itraconazol -griseofulvina -medicamentos para tratar convulsiones, tales como carbamazepina, felbamato, oxcarbazepina, fenitona, topiramato -modafinil -fenilbutazona -rifampicina -algunos medicamentos para tratar la infeccin por VIH tales como atazanavir, indinavir, lopinavir, nelfinavir, tipranavir, ritonavir -hierba de [place] Puede ser que esta lista no menciona todas las posibles interacciones. Informe a su profesional de la salud de todos los productos a base de hierbas, medicamentos de venta libre o suplementos nutritivos que est tomando. Si usted fuma, consume bebidas alcohlicas o si utiliza drogas ilegales, indqueselo tambin a su profesional de la salud. Algunas sustancias pueden interactuar con su medicamento. A qu debo estar atento al usar este medicamento? Este medicamento no protege contra la infeccin por el VIH (SIDA) o otras enfermedades de transmisin sexual. Usted debe sentir el implante al presionar con las yemas de los dedos sobre la piel donde se insert. Dgale a su mdico si no se siente el implante. Qu efectos secundarios puedo tener al utilizar este medicamento? Efectos secundarios que debe informar a su mdico o a su profesional de la salud tan pronto como sea posible: -reacciones alrgicas como erupcin cutnea, picazn o urticarias, hinchazn de la cara, labios o lengua -ndulos mamarios -cambios en la visin -confusin, dificultad para hablar o entender -orina de color oscura -humor deprimido -sensacin general de estar enfermo o sntomas gripales -heces claras -prdida del apetito, nuseas -dolor en la regin abdominal superior derecha -dolores de cabeza severos -dolor, hinchazon o sensibilidad grave en el abdomen -falta de aliento, dolor en el pecho, hinchazn de la  pierna -seales de un embarazo -entumecimiento o debilidad repentina de la cara, brazo o pierna -dificultad para caminar, mareos, prdida de equilibrio o coordinacin -sangrado o flujo vaginal inusual -cansancio o debilidad inusual -color amarillento de los ojos o la piel   Efectos secundarios que, por lo general, no requieren atencin mdica (debe informarlos a su mdico o a Producer, television/film/video de la salud si persisten o si son molestos): -acn -dolor de pecho -cambios de peso -tos -fiebre o escalofros -dolor de cabeza -sangrado menstrual irregular -picazn, ardo o flujo vaginal -dolor o dificultad para Geographical information systems officer -dolor de garganta Puede ser que esta lista no menciona todos los posibles efectos secundarios. Comunquese a su mdico por asesoramiento mdico [employer]. Usted puede informar los efectos secundarios a la FDA por telfono al 1-800-FDA-1088. Dnde debo guardar mi medicina? Este medicamento se administra en hospitales o clnicas y no necesitar guardarlo en su domicilio. ATENCIN: Este folleto es un resumen. Puede ser que no cubra toda la posible informacin. Si usted tiene preguntas acerca de esta medicina, consulte con su mdico, su farmacutico o su profesional de Radiographer, therapeutic.  2015, Elsevier/Gold Standard. (2011-08-21 18:26:08)   Informacin sobre Government social research officer  (Preterm Labor Information) El parto prematuro comienza antes de la semana 37 de [place]. La duracin de un embarazo normal es de 39 a 41 semanas.  CAUSAS  Generalmente no hay una causa que pueda identificarse del motivo por el que una mujer comienza un trabajo de parto prematuro. Sin embargo, una de las causas conocidas ms frecuentes son las infecciones. Las infecciones del tero, el cuello, la vagina, el lquido [place], la vejiga, los riones y Teacher, adult education de los pulmones (neumona) pueden hacer que el trabajo de parto se inicie. Otras causas que pueden sospecharse son:   Infecciones urogenitales,  como infecciones por hongos y vaginosis bacteriana.   Anormalidades uterinas (forma del tero, sptum uterino, fibromas, hemorragias en la placenta).   Un cuello que ha sido operado (puede ser que no permanezca cerrado).   Malformaciones del feto.   Gestaciones mltiples (mellizos, trillizos y ms).   Ruptura del saco amnitico.  FACTORES DE RIESGO   Historia previa de parto prematuro.   Tener ruptura prematura de las membranas (RPM).   La placenta cubre la abertura del cuello (placenta previa).   La placenta se separa del tero (abrupcin placentaria).   El cuello es demasiado dbil para contener al beb en el tero (cuello incompetente).   Hay mucho lquido en el saco amnitico (polihidramnios).   Consumo de drogas o hbito de fumar durante Firefighter.   No aumentar de peso lo suficiente durante el [employer].   Mujeres menores de 18 aos o mayores de [address] [place].   Nivel socioeconmico bajo.   Pertenecer a Engineer, production. SNTOMAS  Los signos y sntomas del trabajo de parto prematuro son:   Public librarian similares a los Designer, jewellery, dolor abdominal o dolor de espalda.  Contracciones uterinas regulares, tan frecuentes como seis por hora, sin importar su intensidad (pueden ser suaves o dolorosas).  Contracciones que comienzan en la parte superior del tero y se expanden hacia abajo, a la zona inferior del abdomen y la espalda.   Sensacin de aumento de presin en la pelvis.   Aparece una secrecin acuosa o sanguinolenta por la vagina.  TRATAMIENTO  Segn el tiempo del embarazo y otras [place], el mdico puede indicar reposo en cama. Si es necesario, le indicarn medicamentos para TEFL teacher las contracciones y para Customer service manager los pulmones del feto. Si el trabajo de parto se inicia antes de las 34 semanas de [place], se recomienda la hospitalizacin. El tratamiento depende de las condiciones en que se encuentren usted y el feto.  QU DEBE HACER SI  PIENSA QUE EST EN  TRABAJO DE PARTO PREMATURO?  Comunquese con su mdico inmediatamente. Debe concurrir al hospital para ser controlada inmediatamente.  CMO PUEDE EVITAR EL TRABAJO DE PARTO PREMATURO EN FUTUROS EMBARAZOS?  Usted debe:   Si fuma, abandonar el hbito.  Mantener un peso saludable y evitar sustancias qumicas y drogas innecesarias.  Controlar todo tipo de infeccin.  Informe a su mdico si tiene una historia conocida de trabajo de parto prematuro. Document Released: 04/22/2007 Document Revised: 09/15/2012 [facility] Patient Information 2015 [place], [place]. This information is not intended to replace advice given to you by your health care provider. Make sure you discuss any questions you have with your health care provider.  Vacuna contra el ttanos, la difteria y la tos ferina (Tdap): lo que debe saber (Tdap Vaccine (Tetanus, Diphtheria, Pertussis): What You Need to Know) 1. Por qu vacunarse? El ttanos, la difteria y la tos ferina pueden ser enfermedades muy graves, incluso para los adolescentes y los adultos. La vacuna Tdap nos puede proteger de estas enfermedades. El TTANOS (trismo) provoca entumecimiento y Engineer, materials dolorosa de los msculos, por lo general, en todo el cuerpo.  Puede causar el endurecimiento de los msculos de la cabeza y el cuello, de modo que impide abrir la boca, tragar y en algunos casos, Industrial/product designer. El ttanos causa la muerte de 1 de cada 5 personas que se infectan. La DIFTERIA puede hacer que se forme una membrana gruesa en el fondo de la garganta.  Puede causar problemas respiratorios, parlisis, insuficiencia cardaca e incluso la muerte. La TOS FERINA causa episodios de tos graves, que pueden hacer difcil la respiracin y causar vmitos y trastornos del sueo.  Tambin puede ser la causa de prdida de [place], incontinencia y Surveyor, minerals de Forensic psychologist. Dos de cada 100 adolescentes y 5 de cada 100 adultos que se enferman de tos ferina deben ser  hospitalizados o tienen complicaciones, que podran incluir neumona y [place]. Estas enfermedades son provocadas por bacterias. La difteria y el pertusis se contagian de persona a persona a travs de la tos o el estornudo. El ttanos ingresa al organismo a travs de cortes, rasguos o heridas. Antes de las vacunas, en los [place] se vieron ms de 200000 casos al ao de difteria y tos [place] y cientos de casos de ttanos. Desde el inicio de la vacunacin, los casos de ttanos y difteria han disminuido alrededor del 99% y los casos de tos ferina alrededor del 80%. 2. Madilyn Fireman Tdap La vacuna Tdap protege a adolescentes y adultos contra el ttanos, la difteria y la tos [place]. Una dosis de Tdap se administra a los 11 o 12 aos. Las [employer] no recibieron la vacuna Tdap a esa edad deben recibirla tan pronto como sea posible. Es muy importante que los mdicos y todos aquellos que tengan contacto cercano con bebs menores de 12 meses reciban la Tdap. Las mujeres deben recibir una dosis de Tdap en cada Psychiatrist, para proteger al recin nacido de la tos [place]. Los nios tienen mayor riesgo de complicaciones graves y potencialmente mortales debido a la tos [place]. Una vacuna similar, llamada Td, protege contra el ttanos y la difteria, pero no contra la tos [place]. Cada 10 aos debe recibirse un refuerzo de Td. La Tdap se puede administrar como uno de estos refuerzos, si todava no ha recibido una dosis. Tambin se puede aplicar despus de un corte o quemadura grave para prevenir la infeccin por ttanos. El mdico le dar ms informacin. La Tdap puede administrarse de manera segura simultneamente con  otras vacunas. 3. Algunas personas no deben recibir esta vacuna  Si alguna vez tuvo una reaccin alrgica potencialmente mortal despus de Neomia Dear dosis de la vacuna contra el ttanos, la diferia o la tos [place], O tuvo una alergia grave a cualquiera de los componentes de esta vacuna, no debe aplicarse la  vacuna. Informe a su mdico si usted sufre algn tipo de alergia grave.  Si estuvo en coma o sufri mltiples convulsiones dentro de los [address] posteriores despus de una dosis de DTP o DTaP, no debe recibir la Tdap, salvo que se encuentre otra causa. En este caso puede recibir la Td.  Consulte con su mdico si:  tiene epilepsia u otra enfermedad del sistema nervioso,  siente dolor intenso o se hincha despus de recibir cualquier vacuna contra la difteria, el ttanos o la tos [place],  alguna vez ha sufrido el sndrome de Pension scheme manager,  no se siente Research scientist (life sciences) en que se ha programado la vacuna. 4. Riesgos de [place] reaccin a la vacuna Con cualquier medicamento, incluyendo las vacunas, existe la posibilidad de que aparezcan efectos secundarios. Estos son leves y desaparecen por s solos, pero tambin son posibles las reacciones graves. Despus de [place], es posible tener [address], que causen lesiones por la cada. Sentarse o recostarse durante 15 minutos puede ayudar a International aid/development worker. Informe al mdico si se siente mareado o aturdido, tiene [employer] visin o zumbidos en los odos. Problemas leves despus de la vacuna Tdap (que no interfirieron en otras actividades)  Dolor en el sitio de la inyeccin (alrededor de 1 de cada 4 adolescentes o 2 de cada 3 adultos).  Enrojecimiento o Paramedic de la inyeccin (alrededor de 1 de cada 5 personas)  Fiebre leve de al menos 100,59F (38C) (hasta alrededor de 1 cada 25 adolescentes o 1 de cada 100 adultos)  Dolor de Training and development officer (alrededor de 3 o 4 de cada 10 personas )  Cansancio (alrededor de 1 de cada 3 o 4 personas)  Nuseas, vmitos, diarrea, dolor de estmago (hasta 1 de cada 4 adolescentes o 1 de cada 10 adultos)  Escalofros, dolores corporales, dolores articulares, erupciones, inflamacin de las glndulas (poco frecuente). Problemas moderados despus de la vacuna Tdap (que interfirieron en las [place], pero no  exigieron atencin mdica)  Art therapist de la inyeccin (alrededor de 1 de cada 5 adolescentes o 1 de cada 100 adultos)  Enrojecimiento o inflamacin (hasta 1 de cada 16 adolescentes y 1 de cada 25 adultos)  Fiebre de ms de 102F (38,8C) (alrededor de 1 de cada 100 adolescentes o 1 de cada 250 adultos)  Dolor de Training and development officer (alrededor de 3 de cada 20 adolescentes y 1 de cada 10 adultos)  Nuseas, vmitos, diarrea, dolor de Teaching laboratory technician (hasta 1 o 3 de cada 100 personas)  Hinchazn de todo el brazo en el que se aplic la vacuna (hasta alrededor de 3 de cada 100 personas). Problemas graves despus de la vacuna Tdap (que impidieron Education officer, environmental las actividades habituales; exigieron atencin mdica)  Inflamacin, dolor intenso, sangrado y enrojecimiento en el brazo, en el sitio de la inyeccin (poco frecuente). Despus de la administracin de cualquier vacuna, puede ocurrir Runner, broadcasting/film/video grave (se calcula que menos de 1 en un milln de dosis). 5. Qu pasa si hay una reaccin grave? A qu signos debo estar atento?  Observe todo lo que le preocupe, como signos de una reaccin alrgica grave, fiebre muy alta o cambios en el comportamiento. Los  signos de Runner, broadcasting/film/video grave pueden incluir ronchas, hinchazn de la cara y la garganta, dificultad para respirar, latidos cardacos acelerados, mareos y debilidad. Pueden comenzar entre unos pocos minutos y algunas horas despus de la vacunacin. Qu debo hacer?  Si usted piensa que se trata de una reaccin alrgica grave o de otra emergencia que no puede esperar, llame al 911 o lleve a la persona al hospital ms cercano. Sino, llame a su mdico.  Despus, la reaccin debe informarse al [employer] de Informacin sobre Efectos Adversos de las [place] (Vaccine Adverse Event Reporting System,VAERS). Su mdico puede presentar este informe, o puede hacerlo usted mismo a travs del sitio web de VAERS, en www.vaers.LAgents.no, o llamando al  [phone]. El VAERS es solo para Biomedical engineer. No brindan consejo mdico. 6. Programa Nacional de Compensacin de Daos por [employer] [place] de Compensacin de Daos por Administrator, arts (National Vaccine Injury Compensation Program, VICP) es un programa federal que fue creado para Patent examiner a las personas que puedan haber sufrido daos al recibir ciertas vacunas. Aquellas personas que consideren que han sufrido un dao como consecuencia de una vacuna y quieren saber ms acerca del programa y cmo presentar Roslynn Amble, pueden llamar al [phone] o visitar su sitio web en [email or web address]. 7. Cmo puedo obtener ms informacin?  Consulte a su mdico.  Comunquese con el servicio de salud de su localidad o [address].  Comunquese con los Centros para Air traffic controller y la Prevencin de Child psychotherapist for Disease Control and Prevention , CDC).  Llame al [phone] o visite el sitio web de [employer] en PicCapture.uy. Declaracin de informacin sobre la vacuna contra la difteria, el ttanos y la tos ferina (Tdap) de los CDC (06/05/11) Document Released: 12/31/2011 Document Revised: 05/30/2013 ExitCare Patient Information 2015 [place], [place]. This information is not intended to replace advice given to you by your health care provider. Make sure you discuss any questions you have with your health care provider.

## 2014-07-07 NOTE — Progress Notes (Signed)
28 week labs. C/O contractions. Last IC yesterday so can't do fFN. Cervix long and closed. No urinary or GI complaints. No VB, LOF, vaginal discharge. Increase fluids and rest. No IC x 1 week if causing frequent UC's.

## 2014-07-08 LAB — GLUCOSE TOLERANCE, 1 HOUR (50G) W/O FASTING: Glucose, 1 Hour GTT: 97 mg/dL (ref 70–140)

## 2014-07-08 LAB — CULTURE, OB URINE

## 2014-07-08 LAB — HIV ANTIBODY (ROUTINE TESTING W REFLEX): HIV: NONREACTIVE

## 2014-07-20 ENCOUNTER — Ambulatory Visit (INDEPENDENT_AMBULATORY_CARE_PROVIDER_SITE_OTHER): Payer: Medicaid Other | Admitting: Family

## 2014-07-20 VITALS — BP 101/55 | HR 75 | Wt 117.2 lb

## 2014-07-20 DIAGNOSIS — Z3482 Encounter for supervision of other normal pregnancy, second trimester: Secondary | ICD-10-CM

## 2014-07-20 LAB — POCT URINALYSIS DIP (DEVICE)
BILIRUBIN URINE: NEGATIVE
Glucose, UA: NEGATIVE mg/dL
HGB URINE DIPSTICK: NEGATIVE
Nitrite: NEGATIVE
PH: 7 (ref 5.0–8.0)
Protein, ur: NEGATIVE mg/dL
Specific Gravity, Urine: 1.025 (ref 1.005–1.030)
Urobilinogen, UA: 2 mg/dL — ABNORMAL HIGH (ref 0.0–1.0)

## 2014-07-20 NOTE — Progress Notes (Signed)
Doing well; no questions or concerns.  Intermittent contractions, approximately 4-5/day.  Reviewed signs of labor. Reviewed third trimester labs with patient.

## 2014-08-04 ENCOUNTER — Encounter: Payer: Medicaid Other | Admitting: Family

## 2014-08-14 ENCOUNTER — Ambulatory Visit (INDEPENDENT_AMBULATORY_CARE_PROVIDER_SITE_OTHER): Payer: Self-pay | Admitting: Advanced Practice Midwife

## 2014-08-14 VITALS — BP 114/57 | HR 72 | Temp 98.7°F | Wt 120.9 lb

## 2014-08-14 DIAGNOSIS — Z3482 Encounter for supervision of other normal pregnancy, second trimester: Secondary | ICD-10-CM

## 2014-08-14 LAB — POCT URINALYSIS DIP (DEVICE)
Bilirubin Urine: NEGATIVE
Glucose, UA: NEGATIVE mg/dL
Hgb urine dipstick: NEGATIVE
Ketones, ur: NEGATIVE mg/dL
NITRITE: NEGATIVE
Protein, ur: NEGATIVE mg/dL
Specific Gravity, Urine: 1.02 (ref 1.005–1.030)
UROBILINOGEN UA: 1 mg/dL (ref 0.0–1.0)
pH: 7.5 (ref 5.0–8.0)

## 2014-08-14 NOTE — Patient Instructions (Signed)
Contracciones de Braxton Hicks °(Braxton Hicks Contractions) °Durante el embarazo, pueden presentarse contracciones uterinas que no siempre indican que está en trabajo de parto.  °¿QUÉ SON LAS CONTRACCIONES DE BRAXTON HICKS?  °Las contracciones que se presentan antes del trabajo de parto se conocen como contracciones de Braxton Hicks o falso trabajo de parto. Hacia el final del embarazo (32 a 34 semanas), estas contracciones pueden aparecen con más frecuencia y volverse más intensas. No corresponden al trabajo de parto verdadero porque estas contracciones no producen el agrandamiento (la dilatación) y el afinamiento del cuello del útero. Algunas veces, es difícil distinguirlas del trabajo de parto verdadero porque en algunos casos pueden ser muy intensas, y las personas tienen diferentes niveles de tolerancia al dolor. No debe sentirse avergonzada si concurre al hospital con falso trabajo de parto. En ocasiones, la única forma de saber si el trabajo de parto es verdadero es que el médico determine si hay cambios en el cuello del útero. °Si no hay problemas prenatales u otras complicaciones de salud asociadas con el embarazo, no habrá inconvenientes si la envían a su casa con falso trabajo de parto y espera que comience el verdadero. °CÓMO DIFERENCIAR EL TRABAJO DE PARTO FALSO DEL VERDADERO °Falso trabajo de parto °· Las contracciones del falso trabajo de parto duran menos y no son tan intensas como las verdaderas. °· Generalmente son irregulares. °· A menudo, se sienten en la parte delantera de la parte baja del abdomen y en la ingle, °· y pueden desaparecer cuando camina o cambia de posición mientras está acostada. °· Las contracciones se vuelven más débiles y su duración es menor a medida que el tiempo transcurre. °· Por lo general, no se hacen progresivamente más intensas, regulares y cercanas entre sí como en el caso del trabajo de parto verdadero. °Verdadero trabajo de parto °· Las contracciones del verdadero  trabajo de parto duran de 30 a 70 segundos, son muy regulares y suelen volverse más intensas, y aumenta su frecuencia. °· No desaparecen cuando camina. °· La molestia generalmente se siente en la parte superior del útero y se extiende hacia la zona inferior del abdomen y hacia la cintura. °· El médico podrá examinarla para determinar si el trabajo de parto es verdadero. El examen mostrará si el cuello del útero se está dilatando y afinando. °LO QUE DEBE RECORDAR °· Continúe haciendo los ejercicios habituales y siga otras indicaciones que el médico le dé. °· Tome todos los medicamentos como le indicó el médico. °· Concurra a las visitas prenatales regulares. °· Coma y beba con moderación si cree que está en trabajo de parto. °· Si las contracciones de Braxton Hicks le provocan incomodidad: °¨ Cambie de posición: si está acostada o descansando, camine; si está caminando, descanse. °¨ Siéntese y descanse en una bañera con agua tibia. °¨ Beba 2 o 3 vasos de agua. La deshidratación puede provocar contracciones. °¨ Respire lenta y profundamente varias veces por hora. °¿CUÁNDO DEBO BUSCAR ASISTENCIA MÉDICA INMEDIATA? °Solicite atención médica de inmediato si: °· Las contracciones se intensifican, se hacen más regulares y cercanas entre sí. °· Tiene una pérdida de líquido por la vagina. °· Tiene fiebre. °· Elimina mucosidad manchada con sangre. °· Tiene una hemorragia vaginal abundante. °· Tiene dolor abdominal permanente. °· Tiene un dolor en la zona lumbar que nunca tuvo antes. °· Siente que la cabeza del bebé empuja hacia abajo y ejerce presión en la zona pélvica. °· El bebé no se mueve tanto como solía. °Document Released: 10/23/2004 Document Revised: 01/18/2013 °ExitCare® Patient   Information ©2015 ExitCare, LLC. This information is not intended to replace advice given to you by your health care provider. Make sure you discuss any questions you have with your health care provider. ° °

## 2014-08-14 NOTE — Progress Notes (Signed)
Breastfeeding tip of the week reviewed Leukocytes: small

## 2014-08-17 NOTE — Progress Notes (Signed)
Increased BH. Cervix closed and long. PTL precautions.

## 2014-08-22 ENCOUNTER — Ambulatory Visit (INDEPENDENT_AMBULATORY_CARE_PROVIDER_SITE_OTHER): Payer: Medicaid Other | Admitting: Obstetrics & Gynecology

## 2014-08-22 ENCOUNTER — Encounter: Payer: Self-pay | Admitting: Obstetrics & Gynecology

## 2014-08-22 ENCOUNTER — Other Ambulatory Visit: Payer: Self-pay | Admitting: Obstetrics & Gynecology

## 2014-08-22 VITALS — BP 107/55 | HR 72 | Temp 98.2°F | Wt 120.9 lb

## 2014-08-22 DIAGNOSIS — Z3482 Encounter for supervision of other normal pregnancy, second trimester: Secondary | ICD-10-CM | POA: Diagnosis present

## 2014-08-22 DIAGNOSIS — B3731 Acute candidiasis of vulva and vagina: Secondary | ICD-10-CM

## 2014-08-22 DIAGNOSIS — B373 Candidiasis of vulva and vagina: Secondary | ICD-10-CM

## 2014-08-22 DIAGNOSIS — Z789 Other specified health status: Secondary | ICD-10-CM

## 2014-08-22 LAB — OB RESULTS CONSOLE GC/CHLAMYDIA
CHLAMYDIA, DNA PROBE: NEGATIVE
Gonorrhea: NEGATIVE

## 2014-08-22 LAB — POCT URINALYSIS DIP (DEVICE)
Bilirubin Urine: NEGATIVE
Glucose, UA: NEGATIVE mg/dL
HGB URINE DIPSTICK: NEGATIVE
KETONES UR: NEGATIVE mg/dL
Nitrite: NEGATIVE
Protein, ur: NEGATIVE mg/dL
Specific Gravity, Urine: 1.025 (ref 1.005–1.030)
UROBILINOGEN UA: 2 mg/dL — AB (ref 0.0–1.0)
pH: 7 (ref 5.0–8.0)

## 2014-08-22 LAB — OB RESULTS CONSOLE GBS: GBS: NEGATIVE

## 2014-08-22 MED ORDER — FLUCONAZOLE 150 MG PO TABS: 150.0000 mg | ORAL_TABLET | Freq: Once | ORAL | Status: DC

## 2014-08-22 NOTE — Progress Notes (Signed)
Subjective:  Gloria Nunez is a 19 y.o. G2P1001 at [redacted]w[redacted]d being seen today for ongoing prenatal care.  Patient reports occasional contractions and vaginal irritation.  Contractions: Irregular.  Vag. Bleeding: None. Movement: Present. Denies leaking of fluid.   The following portions of the patient's history were reviewed and updated as appropriate: allergies, current medications, past family history, past medical history, past social history, past surgical history and problem list.   Objective:   Filed Vitals:   08/22/14 1402  BP: 107/55  Pulse: 72  Temp: 98.2 F (36.8 C)  Weight: 120 lb 14.4 oz (54.84 kg)    Fetal Status: Fetal Heart Rate (bpm): 145 Fundal Height: 37 cm Movement: Present  Presentation: Vertex  General:  Alert, oriented and cooperative. Patient is in no acute distress.  Skin: Skin is warm and dry. No rash noted.   Cardiovascular: Normal heart rate noted  Respiratory: Normal respiratory effort, no problems with respiration noted  Abdomen: Soft, gravid, appropriate for gestational age. Pain/Pressure: Present     Vaginal: Vag. Bleeding: None.    Vag D/C Character: Curdy  Cervix: Exam revealed Dilation: 1.5 Effacement (%): 30 Station: -3  Extremities: Normal range of motion.  Edema: None  Mental Status: Normal mood and affect. Normal behavior. Normal judgment and thought content.   Urinalysis: Urine Protein: Negative Urine Glucose: Negative  Assessment and Plan:  Pregnancy: G2P1001 at [redacted]w[redacted]d  1. Encounter for supervision of other normal pregnancy in second trimester  - Culture, beta strep (group b only) - GC/Chlamydia Probe Amp  2. Language barrier  3. Yeast vaginitis Diflucan  po x1   Preterm labor symptoms and general obstetric precautions including but not limited to vaginal bleeding, contractions, leaking of fluid and fetal movement were reviewed in detail with the patient. Please refer to After Visit Summary for other counseling recommendations.    Return in about 1 week (around 08/29/2014).   Willodean Rosenthal, MD

## 2014-08-22 NOTE — Patient Instructions (Signed)
Parto natural °(Natural Childbirth) °Un parto natural es un parto en el que no se utilizan medicamentos para el dolor ni anestesia (epidural o espinal). Tampoco se utilizan monitores fetales, no se realiza cesárea (una incisión en la parte inferior del abdomen) ni episiotomía (un corte en la parte inferior de la vagina). Con la ayuda de una persona especializada en partos (partera), usted dirigirá su propio parto como desee. °Muchas mujeres eligen realizar un parto natural porque se sienten que pueden controlar mejor el proceso y estar en contacto con el parto y el nacimiento. También lo hacen por preocupación con respecto al efecto que puedan tener los medicamentos en ellas y el bebé. °Las mujeres con un embarazo de alto riesgo no deben intentar realizar un parto natural. Es mejor realizar el parto en un hospital por si aparece alguna situación de emergencia. El médico interviene por la salud y la seguridad de la madre y el bebé. °DOS TÉCNICAS PARA EL PARTO NATURAL:  °· El método Lamaze fomenta a las mujeres que tener un bebé es normal, saludable y natural. También se enseña a la madre a tener una postura neutral con respecto a los medicamentos para el dolor y la anestesia y tomar una decisión informada de si es lo correcto para ellas y del momento adecuado. °· El método Bradley (también llamado Parto Asistido por el Padre) fomenta al padre a ser el asistente en el parto y sostiene un enfoque natural. También fomenta el ejercicio y una dieta balanceada. Los ejercicios enseñan relajación y técnicas de respiración profunda. Sin embargo, existen clases para preparar a los padres para una situación de emergencia. °FORMAS DE CONTROLAR EL DOLOR EN EL PARTO: °· Meditación. °· Yoga. °· Hipnosis. °· Acupuntura. °· Masajes. °· Cambiar posiciones (caminar, mecerse, bañarse, recostarse sobre la pelota de dilatación). °· Recostarse en agua tibia o en un jacuzzi. °· Realice algún tipo de actividad que ayude a enfocarse en otra  cosa que no sea el parto. °· Escuchar música suave. °· Imágenes visuales (enfocarse en un objeto particular). °ANTES DE COMENZAR EL PARTO °· Asegúrese de que usted y su esposo/compañero están de acuerdo en llevar un parto natural. °· Decida si el profesional que lo asiste o una partera asistirá en el parto. °· Decida si tendrá el bebé en un hospital, en una sala de partos o en su hogar. °· Si tiene hijos, haga planes para que alguien los cuide cuando usted esté en el hospital. °· Conozca la distancia y el tiempo que toma llegar a la sala de parto. Vaya previamente para asegurarse. °· Tenga preparado un bolso con un camisón, una bata y artículos de tocador para llevar cuando comience el parto. °· Tenga los números telefónicos de sus amigos y familia a mano por si necesita llamar a alguien cuando comience el parto. °· Su esposo/compañero debe concurrir a todas las clases. °· Hable con el profesional acerca de la posibilidad de una emergencia médica y qué pasará si esto ocurre. °VENTAJAS DEL PARTO NATURAL °· Usted tiene el control del parto. °· Es seguro. °· No se utilizan medicamentos o anestesias que puedan afectar al bebé. °· No hay procedimientos invasivos como la episiotomía. °· Usted y su compañero trabajarán juntos y eso fortalecerá la relación. °· La meditación, el yoga, los masajes y los ejercicios de respiración pueden aprenderse mientras está embarazada y ayudarla cuando esté realizando el parto. °· En la mayor parte de las salas de parto, la familia y amigos pueden involucrarse en el parto y el   proceso de alumbramiento. °DESVENTAJAS DEL PARTO NATURAL °· Sentirá dolor durante el parto. °· Los métodos (descritos anteriormente) para ayudar a aliviar el dolor pueden no funcionar en su caso particular. °· Puede sentirse avergonzada y frustrada si decide cambiar de idea durante el parto y no tener un parto natural. °DESPUÉS DEL PARTO °· Estará muy cansada. °· Sentirá molestias debido a las contracciones del  útero. Sentirá dolor alrededor de la vagina. °· Es posible que sienta frío y temblores, es una reacción natural. °· Se sentirá emocionada, abrumada, exitosa y orgullosa de ser madre. °INSTRUCCIONES PARA EL CUIDADO DOMICILIARIO °· Siga los consejos e indicaciones del profesional que la asiste. °· Siga las indicaciones de su instructor de parto natural (método Lamaze o Bradley). °Document Released: 04/11/2008 Document Revised: 04/07/2011 °ExitCare® Patient Information ©2015 [employer]. This information is not intended to replace advice given to you by your health care provider. Make sure you discuss any questions you have with your health care provider. ° °

## 2014-08-22 NOTE — Progress Notes (Signed)
Spanish interpreter  Pain/pressure- lower abd

## 2014-08-23 LAB — GC/CHLAMYDIA PROBE AMP
CT Probe RNA: NEGATIVE
GC Probe RNA: NEGATIVE

## 2014-08-24 LAB — CULTURE, BETA STREP (GROUP B ONLY)

## 2014-08-31 ENCOUNTER — Ambulatory Visit (INDEPENDENT_AMBULATORY_CARE_PROVIDER_SITE_OTHER): Payer: Medicaid Other | Admitting: Obstetrics and Gynecology

## 2014-08-31 VITALS — BP 107/63 | HR 72 | Temp 98.1°F | Wt 124.0 lb

## 2014-08-31 DIAGNOSIS — Z3482 Encounter for supervision of other normal pregnancy, second trimester: Secondary | ICD-10-CM

## 2014-08-31 LAB — POCT URINALYSIS DIP (DEVICE)
Bilirubin Urine: NEGATIVE
GLUCOSE, UA: NEGATIVE mg/dL
HGB URINE DIPSTICK: NEGATIVE
Ketones, ur: NEGATIVE mg/dL
NITRITE: NEGATIVE
PROTEIN: NEGATIVE mg/dL
Specific Gravity, Urine: 1.02 (ref 1.005–1.030)
Urobilinogen, UA: 1 mg/dL (ref 0.0–1.0)
pH: 7.5 (ref 5.0–8.0)

## 2014-08-31 NOTE — Progress Notes (Signed)
Subjective:  Gloria Nunez is a 19 y.o. G2P1001 at [redacted]w[redacted]d being seen today for ongoing prenatal care.  Patient reports no complaints.  Contractions: Irregular.  Vag. Bleeding: None. Movement: Present. Denies leaking of fluid.   The following portions of the patient's history were reviewed and updated as appropriate: allergies, current medications, past family history, past medical history, past social history, past surgical history and problem list.   Objective:   Filed Vitals:   08/31/14 1421  BP: 107/63  Pulse: 72  Temp: 98.1 F (36.7 C)  Weight: 124 lb (56.246 kg)    Fetal Status: Fetal Heart Rate (bpm): 139   Movement: Present     General:  Alert, oriented and cooperative. Patient is in no acute distress.  Skin: Skin is warm and dry. No rash noted.   Cardiovascular: Normal heart rate noted  Respiratory: Normal respiratory effort, no problems with respiration noted  Abdomen: Soft, gravid, appropriate for gestational age. Pain/Pressure: Present     Vaginal: Vag. Bleeding: None.       Cervix: Not evaluated        Extremities: Normal range of motion.  Edema: None  Mental Status: Normal mood and affect. Normal behavior. Normal judgment and thought content.   Urinalysis: Urine Protein: Negative Urine Glucose: Negative  Assessment and Plan:  Pregnancy: G2P1001 at [redacted]w[redacted]d  1. Encounter for supervision of other normal pregnancy in second trimester - gc/chlamydia/gbs reviewed, negative - continue PNV  Preterm labor symptoms and general obstetric precautions including but not limited to vaginal bleeding, contractions, leaking of fluid and fetal movement were reviewed in detail with the patient. Please refer to After Visit Summary for other counseling recommendations.  No Follow-up on file.   Kathrynn Running, MD

## 2014-09-07 ENCOUNTER — Ambulatory Visit (INDEPENDENT_AMBULATORY_CARE_PROVIDER_SITE_OTHER): Payer: Medicaid Other | Admitting: Physician Assistant

## 2014-09-07 ENCOUNTER — Encounter: Payer: Self-pay | Admitting: Physician Assistant

## 2014-09-07 VITALS — BP 104/60 | HR 72 | Temp 98.4°F | Wt 123.6 lb

## 2014-09-07 DIAGNOSIS — Z3483 Encounter for supervision of other normal pregnancy, third trimester: Secondary | ICD-10-CM | POA: Diagnosis not present

## 2014-09-07 DIAGNOSIS — Z3482 Encounter for supervision of other normal pregnancy, second trimester: Secondary | ICD-10-CM

## 2014-09-07 LAB — POCT URINALYSIS DIP (DEVICE)
Bilirubin Urine: NEGATIVE
Glucose, UA: NEGATIVE mg/dL
Hgb urine dipstick: NEGATIVE
KETONES UR: NEGATIVE mg/dL
Nitrite: NEGATIVE
PH: 7.5 (ref 5.0–8.0)
PROTEIN: NEGATIVE mg/dL
Specific Gravity, Urine: 1.02 (ref 1.005–1.030)
UROBILINOGEN UA: 1 mg/dL (ref 0.0–1.0)

## 2014-09-07 NOTE — Patient Instructions (Signed)
Pain Relief During Labor and Delivery Everyone experiences pain differently, but labor causes severe pain for many women. The amount of pain you experience during labor and delivery depends on your pain tolerance, contraction strength, and your baby's size and position. There are many ways to prepare for and deal with the pain, including:   Taking prenatal classes to learn about labor and delivery. The more informed you are, the less anxious and afraid you may be. This can help lessen the pain.  Taking pain-relieving medicine during labor and delivery.  Learning breathing and relaxation techniques.  Taking a shower or bath.  Getting massaged.  Changing positions.  Placing an ice pack on your back. Discuss your pain control options with your health care provider during your prenatal visits.  WHAT ARE THE TWO TYPES OF PAIN-RELIEVING MEDICINES? 1. Analgesics. These are medicines that decrease pain without total loss of feeling or muscle movement. 2. Anesthetics. These are medicines that block all feeling, including pain. There can be minor side effects of both types, such as nausea, trouble concentrating, becoming sleepy, and lowering the heart rate of the baby. However, health care providers are careful to give doses that will not seriously affect the baby.  WHAT ARE THE SPECIFIC TYPES OF ANALGESICS AND ANESTHETICS? Systemic Analgesic Systemic pain medicines affect your whole body rather than focusing pain relief on the area of your body experiencing pain. This type of medicine is given either through an IV tube in your vein or by a shot (injection) into your muscle. This medicine will lessen your pain but will not stop it completely. It may also make you sleepy, but it will not make you lose consciousness.  Local Anesthetic Local anesthetic isused tonumb a small area of your body. The medicine is injected into the area of nerves that carry feeling to the vagina, vulva, or the area between  the vagina and anus (perineum).  General Anesthetic This type of medicine causes you to lose consciousness so you do not feel pain. It is usually used only in emergency situations during labor. It is given through an IV tube or face mask. Paracervical Block A paracervical block is a form of local anesthesia given during labor. Numbing medicine is injected into the right and left sides of the cervix and vagina. It helps to lessen the pain caused by contractions and stretching of the cervix. It may have to be given more than once.  Pudendal Block A pudendal block is another form of local anesthesia. It is used to relieve the pain associated with pushing or stretching of the perineum at the time of delivery. An injection is given deep through the vaginal wall into the pudendal nerve in the pelvis, numbing the perineum.  Epidural Anesthetic An epidural is an injection of numbing medicine given in the lower back and into the epidural space near your spinal cord. The epidural numbs the lower half of your body. You may be able to move your legs but will not be allowed to walk. Epidurals can be used for labor, delivery, or cesarean deliveries.  To prevent the medicine from wearing off, a small tube (catheter) may be threaded into the epidural space and taped in place to prevent it from slipping out. Medicine can then be given continuously in small doses through the tube until you deliver. Spinal Block A spinal block is similar to an epidural, but the medicine is injected into the spinal fluid, not the epidural space. A spinal block is only given   once. It starts to relieve pain quickly but lasts only 1-2 hours. Spinal blocks can also be used for cesarean deliveries.  Combined Spinal-Epidural Block Combined spinal-epidural blocks combine the benefits of both the spinal and epidural blocks. The spinal part acts quickly to relieve pain and the epidural provides continuous pain relief. Hydrotherapy Immersion in  warm water during labor may provide comfort and relaxation. It may also help to lessen pain, the use of anesthesia, and the length of labor. However, immersion in water during the delivery (water birth) may have some risk involved and studies to determine safety and risks are ongoing. If you are a healthy woman who is expecting an uncomplicated birth, talk with your health care provider to see if water birth is an option for you.  Document Released: 05/01/2008 Document Revised: 01/18/2013 Document Reviewed: 06/03/2012 ExitCare Patient Information 2015 [employer]. This information is not intended to replace advice given to you by your health care provider. Make sure you discuss any questions you have with your health care provider.  

## 2014-09-07 NOTE — Progress Notes (Signed)
Breastfeeding tip of the week reviewed. 

## 2014-09-07 NOTE — Progress Notes (Signed)
Subjective:  Gloria Nunez is a 19 y.o. G2P1001 at [redacted]w[redacted]d being seen today for ongoing prenatal care.  Patient reports irregular contractions, mucus plug fell out.  Contractions: Irregular.  Vag. Bleeding: None. Movement: Present. Denies leaking of fluid.   The following portions of the patient's history were reviewed and updated as appropriate: allergies, current medications, past family history, past medical history, past social history, past surgical history and problem list.   Objective:   Filed Vitals:   09/07/14 1334  BP: 104/60  Pulse: 72  Temp: 98.4 F (36.9 C)  Weight: 123 lb 9.6 oz (56.065 kg)    Fetal Status: Fetal Heart Rate (bpm): 159 Fundal Height: 37 cm Movement: Present  Presentation: Vertex  General:  Alert, oriented and cooperative. Patient is in no acute distress.  Skin: Skin is warm and dry. No rash noted.   Cardiovascular: Normal heart rate noted  Respiratory: Normal respiratory effort, no problems with respiration noted  Abdomen: Soft, gravid, appropriate for gestational age. Pain/Pressure: Present     Pelvic: Vag. Bleeding: None Vag D/C Character: Mucous   Cervical exam performed Dilation: 1.5 Effacement (%): 30 Station: -3  Extremities: Normal range of motion.  Edema: None  Mental Status: Normal mood and affect. Normal behavior. Normal judgment and thought content.   Urinalysis: Urine Protein: Negative Urine Glucose: Negative  Assessment and Plan:  Pregnancy: G2P1001 at [redacted]w[redacted]d   Term labor symptoms and general obstetric precautions including but not limited to vaginal bleeding, contractions, leaking of fluid and fetal movement were reviewed in detail with the patient. Please refer to After Visit Summary for other counseling recommendations.  Return in about 1 week (around 09/14/2014).   Bertram Denver, PA-C

## 2014-09-09 ENCOUNTER — Encounter (HOSPITAL_COMMUNITY): Payer: Self-pay | Admitting: *Deleted

## 2014-09-09 ENCOUNTER — Inpatient Hospital Stay (HOSPITAL_COMMUNITY)
Admission: AD | Admit: 2014-09-09 | Discharge: 2014-09-11 | DRG: 775 | Disposition: A | Discharge status: Home / Self Care | Payer: Medicaid Other | Source: Ambulatory Visit | Attending: Obstetrics & Gynecology | Admitting: Obstetrics & Gynecology

## 2014-09-09 DIAGNOSIS — Z3A38 38 weeks gestation of pregnancy: Secondary | ICD-10-CM

## 2014-09-09 DIAGNOSIS — Z8249 Family history of ischemic heart disease and other diseases of the circulatory system: Secondary | ICD-10-CM | POA: Diagnosis not present

## 2014-09-09 DIAGNOSIS — Z833 Family history of diabetes mellitus: Secondary | ICD-10-CM | POA: Diagnosis not present

## 2014-09-09 DIAGNOSIS — IMO0001 Reserved for inherently not codable concepts without codable children: Secondary | ICD-10-CM

## 2014-09-09 LAB — CBC
HEMATOCRIT: 32.2 % — AB (ref 36.0–46.0)
Hemoglobin: 10.7 g/dL — ABNORMAL LOW (ref 12.0–15.0)
MCH: 31.2 pg (ref 26.0–34.0)
MCHC: 33.2 g/dL (ref 30.0–36.0)
MCV: 93.9 fL (ref 78.0–100.0)
Platelets: 202 10*3/uL (ref 150–400)
RBC: 3.43 MIL/uL — ABNORMAL LOW (ref 3.87–5.11)
RDW: 13.4 % (ref 11.5–15.5)
WBC: 6.8 10*3/uL (ref 4.0–10.5)

## 2014-09-09 LAB — ABO/RH: ABO/RH(D): B POS

## 2014-09-09 LAB — POCT FERN TEST: POCT FERN TEST: NEGATIVE

## 2014-09-09 LAB — TYPE AND SCREEN
ABO/RH(D): B POS
Antibody Screen: NEGATIVE

## 2014-09-09 MED ORDER — ONDANSETRON HCL 4 MG/2ML IJ SOLN: 4.0000 mg | Freq: Four times a day (QID) | INTRAMUSCULAR | Status: DC | PRN

## 2014-09-09 MED ORDER — ACETAMINOPHEN 325 MG PO TABS: 650.0000 mg | ORAL_TABLET | ORAL | Status: DC | PRN

## 2014-09-09 MED ORDER — OXYCODONE-ACETAMINOPHEN 5-325 MG PO TABS
1.0000 | ORAL_TABLET | ORAL | Status: DC | PRN
  Administered 2014-09-09: 1 via ORAL
  Filled 2014-09-09: qty 1

## 2014-09-09 MED ORDER — HYDROXYZINE HCL 50 MG PO TABS
50.0000 mg | ORAL_TABLET | Freq: Four times a day (QID) | ORAL | Status: DC | PRN
  Filled 2014-09-09: qty 1

## 2014-09-09 MED ORDER — LACTATED RINGERS IV SOLN
INTRAVENOUS | Status: DC
  Administered 2014-09-09: 17:00:00 via INTRAVENOUS

## 2014-09-09 MED ORDER — CITRIC ACID-SODIUM CITRATE 334-500 MG/5ML PO SOLN
30.0000 mL | ORAL | Status: DC | PRN
Start: 2014-09-09 — End: 2014-09-10

## 2014-09-09 MED ORDER — OXYCODONE-ACETAMINOPHEN 5-325 MG PO TABS: 2.0000 | ORAL_TABLET | ORAL | Status: DC | PRN

## 2014-09-09 MED ORDER — FLEET ENEMA 7-19 GM/118ML RE ENEM: 1.0000 | ENEMA | RECTAL | Status: DC | PRN

## 2014-09-09 MED ORDER — OXYTOCIN 40 UNITS IN LACTATED RINGERS INFUSION - SIMPLE MED
62.5000 mL/h | INTRAVENOUS | Status: DC
  Filled 2014-09-09: qty 1000

## 2014-09-09 MED ORDER — FENTANYL CITRATE (PF) 100 MCG/2ML IJ SOLN
100.0000 ug | INTRAMUSCULAR | Status: DC | PRN
  Administered 2014-09-09: 100 ug via INTRAVENOUS
  Filled 2014-09-09: qty 2

## 2014-09-09 MED ORDER — LACTATED RINGERS IV SOLN: 500.0000 mL | INTRAVENOUS | Status: DC | PRN

## 2014-09-09 MED ORDER — LIDOCAINE HCL (PF) 1 % IJ SOLN
30.0000 mL | INTRAMUSCULAR | Status: DC | PRN
  Filled 2014-09-09: qty 30

## 2014-09-09 MED ORDER — OXYTOCIN BOLUS FROM INFUSION
500.0000 mL | INTRAVENOUS | Status: DC
  Administered 2014-09-09: 500 mL via INTRAVENOUS

## 2014-09-09 NOTE — Progress Notes (Signed)
Patient ID: Gloria Nunez, female   DOB: [birth year], 19 y.o.   MRN: [id number] Gloria Nunez is a 19 y.o. G2P1001 at [redacted]w[redacted]d admitted for active labor  Subjective: Breathing through uc's, husband left to take 2yo home- but he wants to be here for birth, encouraged he call someone to come back to hospital w/ him to watch 19yo so he can be here for birth  Objective: BP 103/48 mmHg  Pulse 62  Temp(Src) 97.8 F (36.6 C) (Oral)  Resp 16  Ht 5' 0.5" (1.537 m)  Wt 55.792 kg (123 lb)  BMI 23.62 kg/m2  LMP 12/17/2013    FHT:  FHR: 145 bpm, variability: moderate,  accelerations:  Present,  decelerations:  Absent UC:   q 2-8  SVE:   Dilation: 5.5 Effacement (%): 100 Station: -1 Exam by:: kbooker, cnm small BBOW  Labs: Lab Results  Component Value Date   WBC 6.8 09/09/2014   HGB 10.7* 09/09/2014   HCT 32.2* 09/09/2014   MCV 93.9 09/09/2014   PLT 202 09/09/2014    Assessment / Plan: Spontaneous labor, progressing normally, discussed arom- will wait til fob gets here  Labor: Progressing normally Fetal Wellbeing:  Category I Pain Control:  Labor support without medications, declines epidural/meds Pre-eclampsia: n/a I/D:  n/a Anticipated MOD:  NSVD  Marge Duncans CNM, WHNP-BC 09/09/2014, 5:47 PM

## 2014-09-09 NOTE — H&P (Signed)
Gloria Nunez is a 19 y.o. G4P1001 female at [redacted]w[redacted]d by LMP c/w 11wk u/s, presenting w/ report of uc's since 2030 last night and continuing to worsen.   Reports active fetal movement, contractions: regular, vaginal bleeding: spotting, membranes: intact. Initiated prenatal care at [facility] at 14 wks.   Pregnancy complicated by fetal CP cyst.  H/O term uncomplicated svb x 1  Past Medical History: Past Medical History  Diagnosis Date  . Medical history non-contributory     Past Surgical History: Past Surgical History  Procedure Laterality Date  . No past surgeries      Obstetrical History: OB History    Gravida Para Term Preterm AB TAB SAB Ectopic Multiple Living   Social History: Social History   Social History  . Marital Status: Single    Spouse Name: N/A  . Number of Children: N/A  . Years of Education: N/A   Social History Main Topics  . Smoking status: Never Smoker   . Smokeless tobacco: Never Used  . Alcohol Use: No  . Drug Use: No  . Sexual Activity: Yes    Birth Control/ Protection: None   Other Topics Concern  . None   Social History Narrative    Family History: Family History  Problem Relation Age of Onset  . Diabetes Maternal Grandfather   . Heart disease Maternal Grandfather     Allergies: Allergies  Allergen Reactions  . Other Nausea Only    mayo    Prescriptions prior to admission  Medication Sig Dispense Refill Last Dose  . Prenatal Vit-Fe Fumarate-FA (PRENATAL MULTIVITAMIN) TABS tablet Take 1 tablet by mouth daily at 12 noon.   09/08/2014 at Unknown time  . terconazole (TERAZOL 7) 0.4 % vaginal cream Place 1 applicator vaginally at bedtime. x 7 nights 45 g 0 Past Week at Unknown time     Review of Systems  Pertinent pos/neg as indicated in HPI    Blood pressure 121/62, pulse 80, temperature 98.4 F (36.9 C), temperature source Oral, resp. rate 18, last menstrual period 12/17/2013. General appearance: alert,  cooperative and mild distress Lungs: clear to auscultation bilaterally Heart: regular rate and rhythm Abdomen: gravid, soft, non-tender Extremities: trace edema DTR's 2+  Fetal monitoring: FHR: 125 bpm, variability: moderate,  Accelerations: Present,  decelerations:  Absent Uterine activity: regular  Dilation: 4.5 Effacement (%): 100 Station: -1 Exam by:: Sharen Hint RN Presentation: cephalic  Changed from 3.5/70/-3 1hr earlier   Prenatal labs: ABO, Rh: B/POS/-- (03/04 1130) Antibody: NEG (03/04 1130) Rubella:   RPR: NON REAC (06/10 1111)  HBsAg: NEGATIVE (03/04 1130)  HIV: NONREACTIVE (06/10 1111)  GBS: Negative (07/26 0000)   1 hr Glucola: 97 Genetic screening:  normal Anatomy US: normal except small CP cyst  Results for orders placed or performed during the hospital encounter of 09/09/14 (from the past 24 hour(s))  Fern Test   Collection Time: 09/09/14  1:28 PM  Result Value Ref Range   POCT Fern Test Negative = intact amniotic membranes      Assessment:  [redacted]w[redacted]d SIUP  G2P1001  Active labor  Cat 1 FHR  GBS Negative (07/26 0000)  Plan:  Admit to BS  IV pain meds/epidural prn active labor  Expectant management  Anticipate NSVB   Plans to breastfeed  Contraception: nexplanon  Circumcision: no  Marge Duncans CNM, WHNP-BC 09/09/2014, 2:43 PM

## 2014-09-09 NOTE — MAU Note (Signed)
Pt to go to room 173 per [facility], RN charge.

## 2014-09-09 NOTE — MAU Note (Signed)
Pt states contractions started around 2030 on 09/08/14 and they are 5 minutes apart.  Pt states that she that bleeding started around the same time and was a tiny amount but then progressed to about the size of a quarter.  Pt states that she woke up around 0800 this morning with wet panties but is unsure if her water has broken.  Pt states that she is able to feel the baby move.

## 2014-09-09 NOTE — Progress Notes (Signed)
Patient ID: Gloria Nunez, female   DOB: 1995/01/28, 19 y.o.   MRN: [id number] Gloria Nunez is a 19 y.o. G2P1001 at [redacted]w[redacted]d admitted for active labor  Subjective: Breathing w/ uc's, fob is here now  Objective: BP 119/60 mmHg  Pulse 77  Temp(Src) 97.5 F (36.4 C) (Oral)  Resp 18  Ht 5' 0.5" (1.537 m)  Wt 55.792 kg (123 lb)  BMI 23.62 kg/m2  LMP 12/17/2013    FHT:  FHR: 120 bpm, variability: moderate,  accelerations:  Present,  decelerations:  Absent UC:   regular, every 2-6 minutes  SVE:   Dilation: 6 Effacement (%): 100 Station: 0 Exam by:: Genella Rife, CNM AROM small amount clear fluid  Labs: Lab Results  Component Value Date   WBC 6.8 09/09/2014   HGB 10.7* 09/09/2014   HCT 32.2* 09/09/2014   MCV 93.9 09/09/2014   PLT 202 09/09/2014    Assessment / Plan: Spontaneous labor, progressing normally, now arom'd  Labor: Progressing normally Fetal Wellbeing:  Category I Pain Control:  Labor support without medications Pre-eclampsia: n/a I/D:  n/a Anticipated MOD:  NSVD  Marge Duncans CNM, WHNP-BC 09/09/2014, 8:05 PM

## 2014-09-10 LAB — RPR: RPR: NONREACTIVE

## 2014-09-10 LAB — HIV ANTIBODY (ROUTINE TESTING W REFLEX): HIV Screen 4th Generation wRfx: NONREACTIVE

## 2014-09-10 MED ORDER — LANOLIN HYDROUS EX OINT: TOPICAL_OINTMENT | CUTANEOUS | Status: DC | PRN

## 2014-09-10 MED ORDER — COMPLETENATE 29-1 MG PO CHEW
1.0000 | CHEWABLE_TABLET | Freq: Every day | ORAL | Status: DC
  Administered 2014-09-10: 1 via ORAL
  Filled 2014-09-10: qty 1

## 2014-09-10 MED ORDER — TETANUS-DIPHTH-ACELL PERTUSSIS 5-2.5-18.5 LF-MCG/0.5 IM SUSP: 0.5000 mL | Freq: Once | INTRAMUSCULAR | Status: DC

## 2014-09-10 MED ORDER — ONDANSETRON HCL 4 MG PO TABS: 4.0000 mg | ORAL_TABLET | ORAL | Status: DC | PRN

## 2014-09-10 MED ORDER — ACETAMINOPHEN 325 MG PO TABS: 650.0000 mg | ORAL_TABLET | ORAL | Status: DC | PRN

## 2014-09-10 MED ORDER — IBUPROFEN 600 MG PO TABS
600.0000 mg | ORAL_TABLET | Freq: Four times a day (QID) | ORAL | Status: DC
  Administered 2014-09-10 (×2): 600 mg via ORAL
  Filled 2014-09-10 (×3): qty 1

## 2014-09-10 MED ORDER — OXYCODONE-ACETAMINOPHEN 5-325 MG PO TABS: 2.0000 | ORAL_TABLET | ORAL | Status: DC | PRN

## 2014-09-10 MED ORDER — ZOLPIDEM TARTRATE 5 MG PO TABS: 5.0000 mg | ORAL_TABLET | Freq: Every evening | ORAL | Status: DC | PRN

## 2014-09-10 MED ORDER — WITCH HAZEL-GLYCERIN EX PADS: 1.0000 "application " | MEDICATED_PAD | CUTANEOUS | Status: DC | PRN

## 2014-09-10 MED ORDER — OXYCODONE-ACETAMINOPHEN 5-325 MG PO TABS: 1.0000 | ORAL_TABLET | ORAL | Status: DC | PRN

## 2014-09-10 MED ORDER — BENZOCAINE-MENTHOL 20-0.5 % EX AERO: 1.0000 "application " | INHALATION_SPRAY | CUTANEOUS | Status: DC | PRN

## 2014-09-10 MED ORDER — DIPHENHYDRAMINE HCL 25 MG PO CAPS: 25.0000 mg | ORAL_CAPSULE | Freq: Four times a day (QID) | ORAL | Status: DC | PRN

## 2014-09-10 MED ORDER — DIBUCAINE 1 % RE OINT: 1.0000 "application " | TOPICAL_OINTMENT | RECTAL | Status: DC | PRN

## 2014-09-10 MED ORDER — PRENATAL MULTIVITAMIN CH
1.0000 | ORAL_TABLET | Freq: Every day | ORAL | Status: DC
  Filled 2014-09-10: qty 1

## 2014-09-10 MED ORDER — ONDANSETRON HCL 4 MG/2ML IJ SOLN: 4.0000 mg | INTRAMUSCULAR | Status: DC | PRN

## 2014-09-10 MED ORDER — SENNOSIDES-DOCUSATE SODIUM 8.6-50 MG PO TABS
2.0000 | ORAL_TABLET | ORAL | Status: DC
  Administered 2014-09-10: 2 via ORAL
  Filled 2014-09-10 (×2): qty 2

## 2014-09-10 MED ORDER — IBUPROFEN 100 MG/5ML PO SUSP
600.0000 mg | Freq: Four times a day (QID) | ORAL | Status: DC
  Administered 2014-09-10 – 2014-09-11 (×4): 600 mg via ORAL
  Filled 2014-09-10 (×4): qty 30

## 2014-09-10 MED ORDER — SIMETHICONE 80 MG PO CHEW: 80.0000 mg | CHEWABLE_TABLET | ORAL | Status: DC | PRN

## 2014-09-10 NOTE — Lactation Note (Addendum)
This note was copied from the chart of Gloria Nunez. Lactation Consultation Note   Initial consult with this mom of a term baby, now 69 hours old. Mom has already breast fed successfully a few time, and baby has already voided and tooled. Mom reports latch painful at  Ttmes. Mom has small, semi flat, short shafted nipples. i fitted mom with a 16 nipple shield, and latched the baby. He is sleepy - a few suckles, and stopped. Mom to call for assistance of applying nipple shield with next feeding. I also left a 20 nipples shield in the room for mom to try.  On exam of the baby's mouth, I noted a small cleft in the tip of his tongue, and with elevation his tongue bowls slightly, and his tongue sits behind his gum line. I have not done finger sucking with him yet, but will further assess later today, when baby is more awake.    Patient Name: Gloria Nunez [id number] Date: 09/10/2014     Maternal Data Formula Feeding for Exclusion: No Has patient been taught Hand Expression?: Yes Does the patient have breastfeeding experience prior to this delivery?: Yes  Feeding Feeding Type: Breast Fed Length of feed: 15 min  LATCH Score/Interventions Latch: Too sleepy or reluctant, no latch achieved, no sucking elicited. Intervention(s): Skin to skin;Waking techniques;Teach feeding cues  Audible Swallowing: None  Type of Nipple: Flat (fitted mom iwth 16 nipple shiled. this may be too tight, so I also left a 20 shield in the room. )  Comfort (Breast/Nipple): Soft / non-tender     Hold (Positioning): Assistance needed to correctly position infant at breast and maintain latch. Intervention(s): Breastfeeding basics reviewed;Support Pillows;Position options;Skin to skin  LATCH Score: 4  Lactation Tools Discussed/Used     Consult Status Consult Status: Follow-up Date: 09/11/14 Follow-up type: In-patient    Alfred Levins 09/10/2014, 11:41 AM

## 2014-09-10 NOTE — Progress Notes (Signed)
Post Partum Day 1 Subjective:  Gloria Nunez is a 19 y.o. [id number] [redacted]w[redacted]d s/p SVD.  No acute events overnight.  Pt denies problems with ambulating or po intake.  Patient has not voided on her own yet. She denies nausea or vomiting.  Pain is well controlled.  She has had flatus. She has not had bowel movement.  Lochia Minimal.  Plan for birth control is nexplanon.  Method of Feeding: Breast & Bottle  Objective: Blood pressure 100/52, pulse 64, temperature 98 F (36.7 C), temperature source Oral, resp. rate 18, height 5' 0.5" (1.537 m), weight 55.792 kg (123 lb), last menstrual period 12/17/2013, SpO2 99 %, unknown if currently breastfeeding.  Physical Exam:  General: alert, cooperative and no distress Lochia:normal flow Chest: CTAB Heart: RRR no m/r/g Abdomen: +BS, soft, nontender,  Uterine Fundus: firm, below umbilicus  DVT Evaluation: No evidence of DVT seen on physical exam. Extremities: no edema   Recent Labs  09/09/14 1445  HGB 10.7*  HCT 32.2*    Assessment/Plan:  ASSESSMENT: Gloria Nunez is a 19 y.o. [id number] [redacted]w[redacted]d s/p SVD  Encourage ambulation; monitor voiding   Plan for discharge tomorrow, Breastfeeding and Contraception Nexplanon   LOS: 1 day   De Hollingshead 09/10/2014, 7:41 AM

## 2014-09-10 NOTE — Progress Notes (Signed)
Post Partum Day 1 Subjective: Eating, drinking, ambulating well.  +flatus.  Hasn't voided on her own since birth- per her had to be catheterized. Lochia and pain wnl.  Denies dizziness, lightheadedness, or sob. No complaints.   Objective: Blood pressure 100/52, pulse 64, temperature 98 F (36.7 C), temperature source Oral, resp. rate 18, height 5' 0.5" (1.537 m), weight 55.792 kg (123 lb), last menstrual period 12/17/2013, SpO2 99 %, unknown if currently breastfeeding.  Physical Exam:  General: alert, cooperative and no distress Lochia: appropriate Uterine Fundus: firm Incision: n/a DVT Evaluation: No evidence of DVT seen on physical exam. Negative Homan's sign. No cords or calf tenderness.   Recent Labs  09/09/14 1445  HGB 10.7*  HCT 32.2*    Assessment/Plan: Plan for discharge tomorrow  Breast & bottlefeeding Plans for nexplanon No circumcision Reported to oncoming shift to monitor ability to void   LOS: 1 day   Marge Duncans 09/10/2014, 8:51 AM

## 2014-09-11 ENCOUNTER — Ambulatory Visit: Payer: Self-pay

## 2014-09-11 MED ORDER — IBUPROFEN 100 MG/5ML PO SUSP: 600.0000 mg | Freq: Four times a day (QID) | ORAL | Status: DC

## 2014-09-11 NOTE — Discharge Summary (Signed)
Obstetric Discharge Summary Reason for Admission: onset of labor Prenatal Procedures: ultrasound Intrapartum Procedures: spontaneous vaginal delivery Postpartum Procedures: none Complications-Operative and Postpartum: none HEMOGLOBIN  Date Value Ref Range Status  09/09/2014 10.7* 12.0 - 15.0 g/dL Final   HCT  Date Value Ref Range Status  09/09/2014 32.2* 36.0 - 46.0 % Final    Physical Exam:  General: alert, cooperative, appears stated age and no distress Lochia: appropriate Uterine Fundus: firm Incision: n/a DVT Evaluation: No evidence of DVT seen on physical exam. Negative Homan's sign. No cords or calf tenderness.  Discharge Diagnoses: Amnionitis  Discharge Information: Date: 09/11/2014 Activity: pelvic rest Diet: routine Medications: PNV and Ibuprofen Condition: stable and improved Instructions: refer to practice specific booklet Discharge to: home   Newborn Data: Live born female  Birth Weight: 6 lb 10.2 oz (3011 g) APGAR: 8, 9  Home with mother.  Gloria Nunez, Gloria Nunez 09/11/2014, 7:03 AM

## 2014-09-11 NOTE — Lactation Note (Signed)
This note was copied from the chart of Gloria Aliah Eriksson. Lactation Consultation Note: Chiropractor on the phone for teaching at the bedside. Mother's breast are full and firm with bilateral cracks. Assist mother with pre pumping with hand pump. Mother was taught proper application of #20 nipple shield. Infant latched with good deep latch. I did breast massage and compression. Observed infant gulping for 20 mins. Mother's  breast softened after feeding.  Assist mother with alternate breast . Infant latched on with slight pinching pain. Advised mother in importance of flanging infants upper and lower lips. Mother very grateful for assistance. She stated that with first child she really didn't get any help and didn't understand well. Advised mother to post pump after feeding. Mother is active with WIC. She states she plans to call them today. Mother was given comfort gels. Advised to continue to hand express. Discussed all topic in Baby and Me book on breastfeeding. Advised in treatment of severe engorgement. Mother receptive to all teaching.   Patient Name: Gloria Nunez [id number] Date: 09/11/2014     Maternal Data    Feeding    LATCH Score/Interventions                      Lactation Tools Discussed/Used     Consult Status      Michel Bickers 09/11/2014, 1:59 PM

## 2014-09-14 ENCOUNTER — Encounter: Payer: Medicaid Other | Admitting: Family Medicine

## 2014-10-18 ENCOUNTER — Ambulatory Visit: Payer: Medicaid Other | Admitting: Obstetrics & Gynecology

## 2015-03-11 ENCOUNTER — Encounter (HOSPITAL_COMMUNITY): Payer: Self-pay | Admitting: *Deleted

## 2015-03-11 ENCOUNTER — Inpatient Hospital Stay (HOSPITAL_COMMUNITY)
Admission: AD | Admit: 2015-03-11 | Discharge: 2015-03-11 | Disposition: A | Discharge status: Home / Self Care | Payer: Medicaid Other | Source: Ambulatory Visit | Attending: Obstetrics & Gynecology | Admitting: Obstetrics & Gynecology

## 2015-03-11 ENCOUNTER — Inpatient Hospital Stay (HOSPITAL_COMMUNITY): Payer: Medicaid Other

## 2015-03-11 DIAGNOSIS — K0510 Chronic gingivitis, plaque induced: Secondary | ICD-10-CM | POA: Diagnosis not present

## 2015-03-11 DIAGNOSIS — O209 Hemorrhage in early pregnancy, unspecified: Secondary | ICD-10-CM | POA: Insufficient documentation

## 2015-03-11 DIAGNOSIS — O9989 Other specified diseases and conditions complicating pregnancy, childbirth and the puerperium: Secondary | ICD-10-CM

## 2015-03-11 DIAGNOSIS — O26891 Other specified pregnancy related conditions, first trimester: Secondary | ICD-10-CM | POA: Insufficient documentation

## 2015-03-11 DIAGNOSIS — O26899 Other specified pregnancy related conditions, unspecified trimester: Secondary | ICD-10-CM

## 2015-03-11 DIAGNOSIS — R109 Unspecified abdominal pain: Secondary | ICD-10-CM

## 2015-03-11 DIAGNOSIS — R112 Nausea with vomiting, unspecified: Secondary | ICD-10-CM | POA: Diagnosis not present

## 2015-03-11 LAB — URINALYSIS, ROUTINE W REFLEX MICROSCOPIC
BILIRUBIN URINE: NEGATIVE
GLUCOSE, UA: NEGATIVE mg/dL
HGB URINE DIPSTICK: NEGATIVE
Ketones, ur: 15 mg/dL — AB
Leukocytes, UA: NEGATIVE
Nitrite: NEGATIVE
Protein, ur: NEGATIVE mg/dL
SPECIFIC GRAVITY, URINE: 1.015 (ref 1.005–1.030)
pH: 7.5 (ref 5.0–8.0)

## 2015-03-11 LAB — CBC
HEMATOCRIT: 32.3 % — AB (ref 36.0–46.0)
HEMOGLOBIN: 10.9 g/dL — AB (ref 12.0–15.0)
MCH: 30.4 pg (ref 26.0–34.0)
MCHC: 33.7 g/dL (ref 30.0–36.0)
MCV: 90.2 fL (ref 78.0–100.0)
Platelets: 224 10*3/uL (ref 150–400)
RBC: 3.58 MIL/uL — ABNORMAL LOW (ref 3.87–5.11)
RDW: 12.6 % (ref 11.5–15.5)
WBC: 6.8 10*3/uL (ref 4.0–10.5)

## 2015-03-11 LAB — POCT PREGNANCY, URINE: PREG TEST UR: POSITIVE — AB

## 2015-03-11 LAB — HCG, QUANTITATIVE, PREGNANCY: hCG, Beta Chain, Quant, S: 23717 m[IU]/mL — ABNORMAL HIGH (ref <5)

## 2015-03-11 LAB — WET PREP, GENITAL
CLUE CELLS WET PREP: NONE SEEN
SPERM: NONE SEEN
TRICH WET PREP: NONE SEEN
YEAST WET PREP: NONE SEEN

## 2015-03-11 MED ORDER — DEXTROSE 5 % IN LACTATED RINGERS IV BOLUS
1000.0000 mL | Freq: Once | INTRAVENOUS | Status: AC
  Administered 2015-03-11: 1000 mL via INTRAVENOUS

## 2015-03-11 MED ORDER — DM-GUAIFENESIN ER 30-600 MG PO TB12
1.0000 | ORAL_TABLET | Freq: Two times a day (BID) | ORAL | Status: DC
  Filled 2015-03-11 (×2): qty 1

## 2015-03-11 MED ORDER — PROMETHAZINE HCL 12.5 MG PO TABS: 12.5000 mg | ORAL_TABLET | Freq: Four times a day (QID) | ORAL | Status: DC | PRN

## 2015-03-11 NOTE — MAU Note (Signed)
Pt states she has been having lower abdominal cramping for one week.  Pt states she also has pain in her ribs all the time.  Pt states the cramping gets worse at night.

## 2015-03-11 NOTE — MAU Provider Note (Signed)
History     CSN: [id number]  Arrival date and time: 03/11/15 0865   None     Chief Complaint  Patient presents with  . Abdominal Cramping   HPI Pt is early pregnant G2P2002?GA who presents with lower abdominal pain. Pt has not done a home pregnancy test.  Pt has had her pain for 2 days with nausea and vomiting. Pt has infant 46 mos old.  Pt was not using anything for contraception. Pt denies spotting or bleeding. Pt also has congestion with cough and sore throat.  Pt has been taking Tylenol. Pt also has a headache  RN note:  Expand All Collapse All   Pt states she has been having lower abdominal cramping for one week. Pt states she also has pain in her ribs all the time. Pt states the cramping gets worse at night.       Past Medical History  Diagnosis Date  . Medical history non-contributory     Past Surgical History  Procedure Laterality Date  . No past surgeries      Family History  Problem Relation Age of Onset  . Diabetes Maternal Grandfather   . Heart disease Maternal Grandfather     Social History  Substance Use Topics  . Smoking status: Never Smoker   . Smokeless tobacco: Never Used  . Alcohol Use: No    Allergies:  Allergies  Allergen Reactions  . Other Nausea Only    mayo    Prescriptions prior to admission  Medication Sig Dispense Refill Last Dose  . ibuprofen (ADVIL,MOTRIN) 100 MG/5ML suspension Take 30 mLs (600 mg total) by mouth every 6 (six) hours. 470 mL 2   . Prenatal Vit-Fe Fumarate-FA (PRENATAL MULTIVITAMIN) TABS tablet Take 1 tablet by mouth daily at 12 noon.   09/08/2014 at Unknown time    Review of Systems  Constitutional: Negative for fever.  HENT: Positive for congestion and sore throat.   Gastrointestinal: Positive for nausea, vomiting and abdominal pain. Negative for diarrhea and constipation.  Genitourinary: Negative for dysuria.  Neurological: Positive for headaches.   Physical Exam   Blood pressure 101/55, pulse 84,  temperature 98.3 F (36.8 C), temperature source Oral, resp. rate 16, last menstrual period 02/24/2015, unknown if currently breastfeeding.  Physical Exam  Nursing note and vitals reviewed. Constitutional: She is oriented to person, place, and time. She appears well-developed and well-nourished. No distress.  HENT:  Head: Normocephalic.  Eyes: Pupils are equal, round, and reactive to light.  Neck: Normal range of motion. Neck supple.  Cardiovascular: Normal rate.   Respiratory: Effort normal.  GI: Soft. She exhibits no distension. There is tenderness. There is no rebound.  Genitourinary:  Small amount of white discharge in vault; cervix clean, mildly tender- bimanual diffusely tender - no appreciable enlargement- no rebound  Musculoskeletal: Normal range of motion.  Neurological: She is alert and oriented to person, place, and time.  Skin: Skin is warm and dry.  Psychiatric: She has a normal mood and affect.    MAU Course  Procedures IV D5LR Results for orders placed or performed during the hospital encounter of 03/11/15 (from the past 24 hour(s))  Urinalysis, Routine w reflex microscopic (not at [facility])     Status: Abnormal   Collection Time: 03/11/15  7:22 PM  Result Value Ref Range   Color, Urine YELLOW YELLOW   APPearance CLEAR CLEAR   Specific Gravity, Urine 1.015 1.005 - 1.030   pH 7.5 5.0 - 8.0   Glucose, UA  NEGATIVE NEGATIVE mg/dL   Hgb urine dipstick NEGATIVE NEGATIVE   Bilirubin Urine NEGATIVE NEGATIVE   Ketones, ur 15 (A) NEGATIVE mg/dL   Protein, ur NEGATIVE NEGATIVE mg/dL   Nitrite NEGATIVE NEGATIVE   Leukocytes, UA NEGATIVE NEGATIVE  Pregnancy, urine POC     Status: Abnormal   Collection Time: 03/11/15  7:36 PM  Result Value Ref Range   Preg Test, Ur POSITIVE (A) NEGATIVE  CBC     Status: Abnormal   Collection Time: 03/11/15  8:07 PM  Result Value Ref Range   WBC 6.8 4.0 - 10.5 K/uL   RBC 3.58 (L) 3.87 - 5.11 MIL/uL   Hemoglobin 10.9 (L) 12.0 - 15.0 g/dL    HCT 78.2 (L) 95.6 - 46.0 %   MCV 90.2 78.0 - 100.0 fL   MCH 30.4 26.0 - 34.0 pg   MCHC 33.7 30.0 - 36.0 g/dL   RDW 21.3 08.6 - 57.8 %   Platelets 224 150 - 400 K/uL  hCG, quantitative, pregnancy     Status: Abnormal   Collection Time: 03/11/15  8:07 PM  Result Value Ref Range   hCG, Beta Chain, Quant, S 46962 (H) <5 mIU/mL  Wet prep, genital     Status: Abnormal   Collection Time: 03/11/15  8:30 PM  Result Value Ref Range   Yeast Wet Prep HPF POC NONE SEEN NONE SEEN   Trich, Wet Prep NONE SEEN NONE SEEN   Clue Cells Wet Prep HPF POC NONE SEEN NONE SEEN   WBC, Wet Prep HPF POC FEW (A) NONE SEEN   Sperm NONE SEEN    2100Care turned over Rochele Pages, CNM [facility] 03/11/2015, 7:38 PM   Ultrasound:  Mean sac diameter: 14.3 mm, 6 weeks 2 days  Subchorionic hemorrhage: Small anterior subchorionic hemorrhage measuring about 1 cm maximal diameter.  Maternal uterus/adnexae: Uterus is anteverted. No myometrial mass lesions identified. Both ovaries are visualized and demonstrate normal follicular changes. No abnormal adnexal masses. No free pelvic fluid.  IMPRESSION: Probable early intrauterine gestational sac, but no yolk sac, fetal pole, or cardiac activity yet visualized. Recommend follow-up quantitative B-HCG levels and follow-up US in 14 days to confirm and assess viability. This recommendation follows SRU consensus guidelines: Diagnostic Criteria for Nonviable Pregnancy Early in the  Assessment and Plan  Abdominal pain in early pregnancy  Subchorionic Hemorrhage Gingivitis- pt needs dental care Nausea/vomting in pregnancy- Rx phenergan  Plan:   Repeat BHCG in 48 hours Reviewed ectopic precautions Given dental referral list RX phenergan  Marlis Edelson, CNM

## 2015-03-12 LAB — GC/CHLAMYDIA PROBE AMP (~~LOC~~) NOT AT ARMC
CHLAMYDIA, DNA PROBE: NEGATIVE
NEISSERIA GONORRHEA: NEGATIVE

## 2015-03-14 ENCOUNTER — Telehealth: Payer: Self-pay | Admitting: Obstetrics & Gynecology

## 2015-03-14 ENCOUNTER — Ambulatory Visit: Payer: Medicaid Other | Admitting: Obstetrics & Gynecology

## 2015-03-14 NOTE — Telephone Encounter (Signed)
Patient and spouse called informing office that she could not come in for STAT lab until Monday due to lack of transportation. Informed and stresses the importance of keeping appointment and coming in as soon as possible. Patient stated that she understood, however could not come in until Monday. Advised patient if they were to obtain sooner transportation to come in for lab.

## 2015-03-19 ENCOUNTER — Ambulatory Visit (INDEPENDENT_AMBULATORY_CARE_PROVIDER_SITE_OTHER): Payer: Medicaid Other | Admitting: Obstetrics and Gynecology

## 2015-03-19 DIAGNOSIS — O0281 Inappropriate change in quantitative human chorionic gonadotropin (hCG) in early pregnancy: Secondary | ICD-10-CM

## 2015-03-19 DIAGNOSIS — O3680X0 Pregnancy with inconclusive fetal viability, not applicable or unspecified: Secondary | ICD-10-CM

## 2015-03-19 LAB — HCG, QUANTITATIVE, PREGNANCY: hCG, Beta Chain, Quant, S: 24135 m[IU]/mL — ABNORMAL HIGH (ref <5)

## 2015-03-19 NOTE — Progress Notes (Signed)
Patient ID: Gloria Nunez, female   DOB: 1995/07/06, 20 y.o.   MRN: [id number] Ms. [name]  is a 20 y.o. G3P2002 at [redacted]w[redacted]d who presents to [facility] today for follow-up quant hCG. Patient failed to return after 48 hours for quant as instructed. The patient was seen in MAU on 03/11/15 and [place] of [address] and US showed Probable eearly IUP without yolk sac or fetal pole visualized. She denies pain, vaginal bleeding or fever today.   OB History  Gravida Para Term Preterm AB SAB TAB Ectopic Multiple Living  0 2    # Outcome Date GA Lbr Len/2nd Weight Sex Delivery Anes PTL Lv  3 Current           2 Term 09/09/14 [redacted]w[redacted]d 12:52 / 00:13 6 lb 10.2 oz (3.011 kg) M Vag-Spont None  Y  1 Term 12/03/12 [redacted]w[redacted]d   M Vag-Spont   Y      Past Medical History  Diagnosis Date  . Medical history non-contributory      LMP 02/24/2015  CONSTITUTIONAL: Well-developed, well-nourished female in no acute distress.  ENT: External right and left ear normal.  EYES: EOM intact, conjunctivae normal.  MUSCULOSKELETAL: Normal range of motion.  CARDIOVASCULAR: Regular heart rate RESPIRATORY: Normal effort NEUROLOGICAL: Alert and oriented to person, place, and time.  SKIN: Skin is warm and dry. No rash noted. Not diaphoretic. No erythema. No pallor. PSYCH: Normal mood and affect. Normal behavior. Normal judgment and thought content.  Results for orders placed or performed in visit on 03/19/15 (from the past 24 hour(s))  hCG, quantitative, pregnancy     Status: Abnormal   Collection Time: 03/19/15 10:45 AM  Result Value Ref Range   hCG, Beta Chain, Quant, S 24135 (H) <5 mIU/mL    A: Appropriate rise in quant hCG after 48 hours  P: Discharge home First trimester/ectopic precautions discussed Patient will return for follow-up US in 1 week. Order placed and RN to schedule. Patient will return to [facility] for results following [place].  Patient may return to MAU as needed or if  her condition were to change or worsen   [name], MD 03/19/2015 12:45 PM

## 2015-03-23 ENCOUNTER — Emergency Department (HOSPITAL_COMMUNITY)
Admission: EM | Admit: 2015-03-23 | Discharge: 2015-03-23 | Disposition: A | Discharge status: Home / Self Care | Payer: Medicaid Other | Attending: Emergency Medicine | Admitting: Emergency Medicine

## 2015-03-23 ENCOUNTER — Encounter (HOSPITAL_COMMUNITY): Payer: Self-pay | Admitting: Emergency Medicine

## 2015-03-23 ENCOUNTER — Emergency Department (HOSPITAL_COMMUNITY): Payer: Medicaid Other

## 2015-03-23 DIAGNOSIS — O209 Hemorrhage in early pregnancy, unspecified: Secondary | ICD-10-CM

## 2015-03-23 DIAGNOSIS — O039 Complete or unspecified spontaneous abortion without complication: Secondary | ICD-10-CM

## 2015-03-23 LAB — CBC
HCT: 36.1 % (ref 36.0–46.0)
Hemoglobin: 12.3 g/dL (ref 12.0–15.0)
MCH: 31.1 pg (ref 26.0–34.0)
MCHC: 34.1 g/dL (ref 30.0–36.0)
MCV: 91.4 fL (ref 78.0–100.0)
Platelets: 222 10*3/uL (ref 150–400)
RBC: 3.95 MIL/uL (ref 3.87–5.11)
RDW: 12.5 % (ref 11.5–15.5)
WBC: 6.3 10*3/uL (ref 4.0–10.5)

## 2015-03-23 LAB — COMPREHENSIVE METABOLIC PANEL
ALBUMIN: 4.6 g/dL (ref 3.5–5.0)
ALK PHOS: 47 U/L (ref 38–126)
ALT: 12 U/L — AB (ref 14–54)
AST: 20 U/L (ref 15–41)
Anion gap: 10 (ref 5–15)
BILIRUBIN TOTAL: 0.8 mg/dL (ref 0.3–1.2)
BUN: 8 mg/dL (ref 6–20)
CALCIUM: 9.6 mg/dL (ref 8.9–10.3)
CO2: 23 mmol/L (ref 22–32)
Chloride: 105 mmol/L (ref 101–111)
Creatinine, Ser: 0.58 mg/dL (ref 0.44–1.00)
GFR calc Af Amer: >60 mL/min (ref >60)
GFR calc non Af Amer: >60 mL/min (ref >60)
GLUCOSE: 94 mg/dL (ref 65–99)
Potassium: 3.6 mmol/L (ref 3.5–5.1)
Sodium: 138 mmol/L (ref 135–145)
TOTAL PROTEIN: 8.1 g/dL (ref 6.5–8.1)

## 2015-03-23 LAB — LIPASE, BLOOD: Lipase: 39 U/L (ref 11–51)

## 2015-03-23 LAB — I-STAT BETA HCG BLOOD, ED (MC, WL, AP ONLY)

## 2015-03-23 LAB — HCG, QUANTITATIVE, PREGNANCY: hCG, Beta Chain, Quant, S: 9261 m[IU]/mL — ABNORMAL HIGH

## 2015-03-23 NOTE — ED Provider Notes (Signed)
CSN: [id number]     Arrival date & time 03/23/15  1030 History   First MD Initiated Contact with Patient 03/23/15 1718     Chief Complaint  Patient presents with  . Abdominal Pain  . Vaginal Bleeding     (Consider location/radiation/quality/duration/timing/severity/associated sxs/prior Treatment) HPI   Gloria Nunez is a G3P2 female who presents to the emergency room today complaining of abdominal pain and vaginal bleeding. Patient states she is currently pregnant, unsure of gestational age. Last period was mid December 2016. Patient states that 4 days ago she began having lower abdominal pain and vaginal bleeding. Patient was seen by her OB/GYN at that time who did a blood test and sent her home. Since that time patient has had increased vaginal bleeding, now going through 8 pads in a day and increased abdominal pain. Patient states today she was lightheaded and dizzy so she came to the emergency department to be evaluated. Patient reports a subjective fever 3 days ago which resolved. She did not take her temperature at home. She is currently afebrile. While in the waiting room patient urinated in the toilet and states that she expelled a large amount of blood and tissue and is concerned that that might have been fetal tissue. Denies syncope, chest pain, lower extremity swelling, vomiting.   Past Medical History  Diagnosis Date  . Medical history non-contributory   . Pregnant    Past Surgical History  Procedure Laterality Date  . No past surgeries     Family History  Problem Relation Age of Onset  . Diabetes Maternal Grandfather   . Heart disease Maternal Grandfather    Social History  Substance Use Topics  . Smoking status: Never Smoker   . Smokeless tobacco: Never Used  . Alcohol Use: No   OB History    Gravida Para Term Preterm AB TAB SAB Ectopic Multiple Living   0 2     Review of Systems  All other systems reviewed and are negative.     Allergies    Other  Home Medications   Prior to Admission medications   Medication Sig Start Date End Date Taking? Authorizing Provider  promethazine (PHENERGAN) 12.5 MG tablet Take 1 tablet (12.5 mg total) by mouth every 6 (six) hours as needed for nausea. Patient not taking: Reported on 03/23/2015 03/11/15   Marlis Edelson, CNM   BP 98/54 mmHg  Pulse 71  Temp(Src) 98.3 F (36.8 C) (Oral)  Resp 20  SpO2 100%  LMP 02/24/2015 Physical Exam  Constitutional: She is oriented to person, place, and time. She appears well-developed and well-nourished. No distress.  HENT:  Head: Normocephalic and atraumatic.  Mouth/Throat: No oropharyngeal exudate.  Eyes: Conjunctivae and EOM are normal. Pupils are equal, round, and reactive to light. Right eye exhibits no discharge. Left eye exhibits no discharge. No scleral icterus.  Cardiovascular: Normal rate, regular rhythm, normal heart sounds and intact distal pulses.  Exam reveals no gallop and no friction rub.   No murmur heard. Pulmonary/Chest: Effort normal and breath sounds normal. No respiratory distress. She has no wheezes. She has no rales. She exhibits no tenderness.  Abdominal: Soft. Bowel sounds are normal. She exhibits no distension. There is tenderness. There is no guarding.    Musculoskeletal: Normal range of motion. She exhibits no edema.  Neurological: She is alert and oriented to person, place, and time.  Skin: Skin is warm and dry. No rash noted. She is  not diaphoretic. No erythema. No pallor.  Psychiatric: She has a normal mood and affect. Her behavior is normal.  Nursing note and vitals reviewed.   ED Course  Procedures (including critical care time) Labs Review Labs Reviewed  COMPREHENSIVE METABOLIC PANEL - Abnormal; Notable for the following:    ALT 12 (*)    All other components within normal limits  HCG, QUANTITATIVE, PREGNANCY - Abnormal; Notable for the following:    hCG, Beta Chain, Quant, S 9261 (*)    All other components  within normal limits  I-STAT BETA HCG BLOOD, ED ([facility], [facility], [facility] ONLY) - Abnormal; Notable for the following:    I-stat hCG, quantitative >2000.0 (*)    All other components within normal limits  LIPASE, BLOOD  CBC  URINALYSIS, ROUTINE W REFLEX MICROSCOPIC (NOT AT [facility])  SURGICAL PATHOLOGY    Imaging Review US Ob Comp Less 14 Wks  03/23/2015  CLINICAL DATA:  Heavy vaginal bleeding with positive pregnancy test. EXAM: OBSTETRIC <14 WK [place] AND TRANSVAGINAL OB US TECHNIQUE: Both transabdominal and transvaginal ultrasound examinations were performed for complete evaluation of the gestation as well as the maternal uterus, adnexal regions, and pelvic cul-de-sac. Transvaginal technique was performed to assess early pregnancy. COMPARISON:  03/11/2015 FINDINGS: Intrauterine gestational sac: Not visualized. Maternal uterus/adnexae: Endometrium is heterogeneous and measures up to 22 mm in thickness. Maternal right ovary is 3.1 x 2.0 x 2.5 cm. The left ovary measures 3.2 x 2.1 x 2.4 cm. Corpus luteum cyst is identified in the left ovarian parenchyma. No free fluid in the cul-de-sac. IMPRESSION: No evidence for intrauterine gestation. Study from 12 days ago suggested the presence of an early intrauterine gestational sac. Given the reported clinical history of heavy vaginal bleeding, imaging features on this study are probably related to completed abortion. Close follow-up is recommended to exclude nonvisualized ectopic pregnancy. Electronically Signed   By: Kennith Center M.D.   On: 03/23/2015 19:36   US Ob Transvaginal  03/23/2015  CLINICAL DATA:  Heavy vaginal bleeding with positive pregnancy test. EXAM: OBSTETRIC <14 WK [place] AND TRANSVAGINAL OB US TECHNIQUE: Both transabdominal and transvaginal ultrasound examinations were performed for complete evaluation of the gestation as well as the maternal uterus, adnexal regions, and pelvic cul-de-sac. Transvaginal technique was performed to assess early pregnancy. COMPARISON:   03/11/2015 FINDINGS: Intrauterine gestational sac: Not visualized. Maternal uterus/adnexae: Endometrium is heterogeneous and measures up to 22 mm in thickness. Maternal right ovary is 3.1 x 2.0 x 2.5 cm. The left ovary measures 3.2 x 2.1 x 2.4 cm. Corpus luteum cyst is identified in the left ovarian parenchyma. No free fluid in the cul-de-sac. IMPRESSION: No evidence for intrauterine gestation. Study from 12 days ago suggested the presence of an early intrauterine gestational sac. Given the reported clinical history of heavy vaginal bleeding, imaging features on this study are probably related to completed abortion. Close follow-up is recommended to exclude nonvisualized ectopic pregnancy. Electronically Signed   By: Kennith Center M.D.   On: 03/23/2015 19:36   I have personally reviewed and evaluated these images and lab results as part of my medical decision-making.   EKG Interpretation None      MDM   Final diagnoses:  Complete abortion    Otherwise healthy G3P2, 20 y.o F presents to the ED with vaginal bleeding and abdominal pain. Pt is pregnant, unknown gestational age. Followed by OBGYN. Pt has had these symptoms for 4 days. Pt was seen by OBGYN on 2/20 and had Hcg quant drawn  which was normal and d/c home. In ED, pt appears well. VSS. No syncope. Hgb stable. Orthostatic vital signs wnl. On pelvic exam, there is gross blood. Tissue seen in vaginal vault, possible products of conception. Specimen collected and sent to pathology. Concern for incomplete AB. Transvaginal US ordered. Hcg quant drawn. Upon review of pts record, pt has transvaginal US performed on 2/12 which revealed "Probable early intrauterine gestational sac, but no yolk sac, fetal pole, or cardiac activity yet visualized. Recommend follow-up quantitative B-HCG levels and follow-up US in 14 days to confirm and assess viability. This recommendation follows SRU consensus guidelines: Diagnostic Criteria for Nonviable Pregnancy Early in  the First Trimester".    Pelvic ultrasound today reveals no evidence of intrauterine gestation. Likely due to complete abortion. HCC quant has decreased by half from 4 days ago. I spoke with the on-call OB/GYN who recommends that patient follow-up in the office within 2 weeks for reevaluation. Patient is Rh+, RhoGam not necessary. Return precautions outlined in patient discharge instructions.   Case discussed with Dr. Radford Pax who agrees with treatment plan.    Lester Kinsman [place], PA-C 03/23/15 2002  Nelva Nay, MD 03/23/15 2024

## 2015-03-23 NOTE — ED Notes (Signed)
Pt departed in NAD.  

## 2015-03-23 NOTE — ED Notes (Signed)
Pelvic perfromed by the pa   Pt to br.  Pt passed some clots vs tissue

## 2015-03-23 NOTE — ED Notes (Signed)
The pt reports that her period was last in December [facility]  She was there earlier today and she was told according to her everything was ok.  Small amount of bright red blood on her sanitary napkin.  Alert no distress

## 2015-03-23 NOTE — ED Notes (Signed)
Pt states "im bleeding a lot, im pregnant, im not sure how far along". Pt has discharge paperwork from a clinic showing subchroinic hematoma. Pt having severe abdominal pain and pelvic pain. Pt is less than [redacted] weeks pregnant. Pt states the bleeding started on the 2/20. Pt states she goes through about 8 pads a day. Pt in NAD, vss. Abdomen tender

## 2015-03-23 NOTE — ED Notes (Signed)
Pt attempted to give urine sample, called out to this nurse. Large amount of blood and tissue noted to be in the toilet. Dr. Hyacinth Meeker observed tissue and instructed okay to flush.

## 2015-03-23 NOTE — Discharge Instructions (Signed)
Aborto espontáneo  °(Miscarriage) °El aborto espontáneo es la pérdida de un bebé que no ha nacido (feto) antes de la semana 20 del embarazo. La mayor parte de estos abortos ocurre en los primeros 3 meses. En algunos casos ocurre antes de que la mujer sepa que está embarazada. También se denomina "aborto espontáneo" o "pérdida prematura del embarazo". El aborto espontáneo puede ser una experiencia que afecte emocionalmente a la persona. Converse con su médico si tiene dudas, cómo es el proceso de duelo, y sobre planes futuros de embarazo.  °CAUSAS  °· Algunos problemas cromosómicos pueden hacer imposible que el bebé se desarrolle normalmente. Los problemas con los genes o cromosomas del bebé son generalmente el resultado de errores que se producen, por casualidad, cuando el embrión se divide y crece. Estos problemas no se heredan de los padres. °· Infección en el cuello del útero.   °· Problemas hormonales.   °· Problemas en el cuello del útero, como tener un útero incompetente. Esto ocurre cuando los tejidos no son lo suficientemente fuertes como para contener el embarazo.   °· Problemas del útero, como un útero con forma anormal, los fibromas o anormalidades congénitas.   °· Ciertas enfermedades crónicas.   °· No fume, no beba alcohol, ni consuma drogas.   °· Traumatismos   °A veces, la causa es desconocida.  °SÍNTOMAS  °· Sangrado o manchado vaginal, con o sin cólicos o dolor. °· Dolor o cólicos en el abdomen o en la cintura. °· Eliminación de líquido, tejidos o coágulos grandes por la vagina. °DIAGNÓSTICO  °El médico le hará un examen físico. También le indicará una ecografía para confirmar el aborto. Es posible que se realicen análisis de sangre.  °TRATAMIENTO  °· En algunos casos el tratamiento no es necesario, si se eliminan naturalmente todos los tejidos embrionarios que se encontraban en el útero. Si el feto o la placenta quedan dentro del útero (aborto incompleto), pueden infectarse, los tejidos que quedan  pueden infectarse y deben retirarse. Generalmente se realiza un procedimiento de dilatación y curetaje (D y C). Durante el procedimiento de dilatación y curetaje, el cuello del útero se abre (dilata) y se retira cualquier resto de tejido fetal o placentario del útero. °· Si hay una infección, le recetarán antibióticos. Podrán recetarle otros medicamentos para reducir el tamaño del útero (contraerlo) si hay una mucho sangrado. °· Si su sangre es Rh negativa y su bebé es Rh positivo, usted necesitará la inyección de inmunoglobulina Rh. Esta inyección protegerá a los futuros bebés de tener problemas de compatibilidad Rh en futuros embarazos. °INSTRUCCIONES PARA EL CUIDADO EN EL HOGAR  °· El médico le indicará reposo en cama o le permitirá realizar actividades livianas. Vuelva a la actividad lentamente o según las indicaciones de su médico. °· Pídale a alguien que la ayude con las responsabilidades familiares y del hogar durante este tiempo.   °· Lleve un registro de la cantidad y la saturación de las toallas higiénicas que utiliza cada día. Anote esta información   °· No use tampones. No No se haga duchas vaginales ni tenga relaciones sexuales hasta que el médico la autorice.   °· Sólo tome medicamentos de venta libre o recetados para calmar el dolor o el malestar, según las indicaciones de su médico.   °· No tome aspirina. La aspirina puede ocasionar hemorragias.   °· Concurra puntualmente a las citas de control con el médico.   °· Si usted o su pareja tienen dificultades con el duelo, hable con su médico para buscar la ayuda psicológica que los ayude a enfrentar la pérdida   del embarazo. Permtase el tiempo suficiente de duelo antes de quedar embarazada nuevamente.  SOLICITE ATENCIN MDICA DE INMEDIATO SI:   Siente calambres intensos o dolor en la espalda o en el abdomen.  Tiene fiebre.  Elimina grandes cogulos de [place] (del tamao de una nuez o ms) o tejidos por la vagina. Guarde lo que ha eliminado para  que su mdico lo examine.   La hemorragia aumenta.   Brett Fairy secrecin vaginal espesa y con mal olor.  Se siente mareada, dbil, o se desmaya.   Siente escalofros.  ASEGRESE DE QUE:   Comprende estas instrucciones.  Controlar su enfermedad.  Solicitar ayuda de inmediato si no mejora o si empeora.   Esta informacin no tiene Theme park manager el consejo del mdico. Asegrese de hacerle al mdico cualquier pregunta que tenga.   Follow up with OBGYN within 2 weeks for re-evaluation. Rest. Take ibuprofen or tylenol as needed for pain. Return to the ED if you experience fever, loss of consciousness, severe increase in your pain, shortness of breath or chest pain.

## 2015-03-27 ENCOUNTER — Ambulatory Visit (HOSPITAL_COMMUNITY): Payer: Medicaid Other

## 2015-04-13 ENCOUNTER — Encounter: Payer: Self-pay | Admitting: Family Medicine

## 2015-04-13 ENCOUNTER — Encounter: Payer: Medicaid Other | Admitting: Family Medicine

## 2015-11-09 ENCOUNTER — Inpatient Hospital Stay (HOSPITAL_COMMUNITY): Payer: Medicaid Other

## 2015-11-09 ENCOUNTER — Encounter: Payer: Self-pay | Admitting: Student

## 2015-11-09 ENCOUNTER — Inpatient Hospital Stay (HOSPITAL_COMMUNITY)
Admission: AD | Admit: 2015-11-09 | Discharge: 2015-11-09 | Disposition: A | Discharge status: Home / Self Care | Payer: Medicaid Other | Source: Ambulatory Visit | Attending: Obstetrics & Gynecology | Admitting: Obstetrics & Gynecology

## 2015-11-09 DIAGNOSIS — R102 Pelvic and perineal pain: Secondary | ICD-10-CM | POA: Diagnosis present

## 2015-11-09 DIAGNOSIS — O210 Mild hyperemesis gravidarum: Secondary | ICD-10-CM | POA: Insufficient documentation

## 2015-11-09 DIAGNOSIS — Z3A08 8 weeks gestation of pregnancy: Secondary | ICD-10-CM | POA: Diagnosis not present

## 2015-11-09 DIAGNOSIS — O219 Vomiting of pregnancy, unspecified: Secondary | ICD-10-CM

## 2015-11-09 DIAGNOSIS — O26891 Other specified pregnancy related conditions, first trimester: Secondary | ICD-10-CM | POA: Insufficient documentation

## 2015-11-09 DIAGNOSIS — Z3491 Encounter for supervision of normal pregnancy, unspecified, first trimester: Secondary | ICD-10-CM

## 2015-11-09 LAB — WET PREP, GENITAL
Clue Cells Wet Prep HPF POC: NONE SEEN
Sperm: NONE SEEN
Trich, Wet Prep: NONE SEEN
YEAST WET PREP: NONE SEEN

## 2015-11-09 LAB — URINALYSIS, ROUTINE W REFLEX MICROSCOPIC
BILIRUBIN URINE: NEGATIVE
Glucose, UA: NEGATIVE mg/dL
HGB URINE DIPSTICK: NEGATIVE
KETONES UR: NEGATIVE mg/dL
Leukocytes, UA: NEGATIVE
NITRITE: NEGATIVE
PROTEIN: NEGATIVE mg/dL
SPECIFIC GRAVITY, URINE: 1.025 (ref 1.005–1.030)
pH: 6 (ref 5.0–8.0)

## 2015-11-09 LAB — POCT PREGNANCY, URINE: PREG TEST UR: POSITIVE — AB

## 2015-11-09 LAB — CBC
HEMATOCRIT: 31.1 % — AB (ref 36.0–46.0)
HEMOGLOBIN: 10.7 g/dL — AB (ref 12.0–15.0)
MCH: 31.1 pg (ref 26.0–34.0)
MCHC: 34.4 g/dL (ref 30.0–36.0)
MCV: 90.4 fL (ref 78.0–100.0)
Platelets: 194 10*3/uL (ref 150–400)
RBC: 3.44 MIL/uL — AB (ref 3.87–5.11)
RDW: 13 % (ref 11.5–15.5)
WBC: 7 10*3/uL (ref 4.0–10.5)

## 2015-11-09 LAB — HCG, QUANTITATIVE, PREGNANCY: HCG, BETA CHAIN, QUANT, S: 181021 m[IU]/mL — AB (ref <5)

## 2015-11-09 LAB — ABO/RH: ABO/RH(D): B POS

## 2015-11-09 MED ORDER — PROMETHAZINE HCL 25 MG PO TABS: 12.5000 mg | ORAL_TABLET | Freq: Four times a day (QID) | ORAL | 2 refills | Status: DC | PRN

## 2015-11-09 MED ORDER — PROMETHAZINE HCL 25 MG/ML IJ SOLN
12.5000 mg | Freq: Once | INTRAMUSCULAR | Status: AC
  Administered 2015-11-09: 12.5 mg via INTRAMUSCULAR
  Filled 2015-11-09: qty 1

## 2015-11-09 NOTE — MAU Provider Note (Signed)
History     CSN: [id number]  Arrival date and time: 11/09/15 4098   First Provider Initiated Contact with Patient 11/09/15 1949      Chief Complaint  Patient presents with  . Pelvic Pain   Pelvic Pain  The patient's primary symptoms include pelvic pain. This is a new problem. The current episode started yesterday. The problem occurs constantly. The problem has been unchanged. Pain severity now: 10/10. The problem affects both sides. She is pregnant. Associated symptoms include abdominal pain, dysuria, headaches, nausea and vomiting. Pertinent negatives include no chills, fever or urgency. The vaginal discharge was normal. There has been no bleeding. Nothing aggravates the symptoms. She has tried nothing for the symptoms. Her menstrual history has been regular (LMP 09/03/15 ).    Past Medical History:  Diagnosis Date  . Medical history non-contributory   . Pregnant     Past Surgical History:  Procedure Laterality Date  . NO PAST SURGERIES      Family History  Problem Relation Age of Onset  . Diabetes Maternal Grandfather   . Heart disease Maternal Grandfather     Social History  Substance Use Topics  . Smoking status: Never Smoker  . Smokeless tobacco: Never Used  . Alcohol use No    Allergies:  Allergies  Allergen Reactions  . Other Nausea And Vomiting and Other (See Comments)    Pt states that she is allergic to mayonnaise.      Prescriptions Prior to Admission  Medication Sig Dispense Refill Last Dose  . Prenatal Vit-Fe Fumarate-FA (PRENATAL MULTIVITAMIN) TABS tablet Take 1 tablet by mouth daily.   Past Week at Unknown time    Review of Systems  Constitutional: Negative for chills and fever.  Gastrointestinal: Positive for abdominal pain, nausea and vomiting.  Genitourinary: Positive for dysuria and pelvic pain. Negative for urgency.  Neurological: Positive for headaches.   Physical Exam   Blood pressure 96/61, pulse 84, temperature 98.9 F (37.2 C),  temperature source Oral, resp. rate 18, last menstrual period 09/03/2015, unknown if currently breastfeeding.  Physical Exam  Nursing note and vitals reviewed. Constitutional: She is oriented to person, place, and time. She appears well-developed and well-nourished. No distress.  HENT:  Head: Normocephalic.  Cardiovascular: Normal rate.   Respiratory: Effort normal.  GI: Soft. There is tenderness (generalized ). There is no rebound.  Neurological: She is alert and oriented to person, place, and time.  Skin: Skin is warm and dry.  Psychiatric: She has a normal mood and affect.    MAU Course  Procedures  MDM 2038: Care turned over to L. Leftwich-Kirby [place] and labs pending  LEFTWICH-KIRBY, Joelynn Dust  9:28 PM 11/09/15   Assessment and Plan   Results for orders placed or performed during the hospital encounter of 11/09/15 (from the past 48 hour(s))  Urinalysis, Routine w reflex microscopic (not at [facility])     Status: None   Collection Time: 11/09/15  7:25 PM  Result Value Ref Range   Color, Urine YELLOW YELLOW   APPearance CLEAR CLEAR   Specific Gravity, Urine 1.025 1.005 - 1.030   pH 6.0 5.0 - 8.0   Glucose, UA NEGATIVE NEGATIVE mg/dL   Hgb urine dipstick NEGATIVE NEGATIVE   Bilirubin Urine NEGATIVE NEGATIVE   Ketones, ur NEGATIVE NEGATIVE mg/dL   Protein, ur NEGATIVE NEGATIVE mg/dL   Nitrite NEGATIVE NEGATIVE   Leukocytes, UA NEGATIVE NEGATIVE    Comment: MICROSCOPIC NOT DONE ON URINES WITH NEGATIVE PROTEIN, BLOOD, LEUKOCYTES, NITRITE, OR GLUCOSE <  1000 mg/dL.  Pregnancy, urine POC     Status: Abnormal   Collection Time: 11/09/15  7:32 PM  Result Value Ref Range   Preg Test, Ur POSITIVE (A) NEGATIVE    Comment:        THE SENSITIVITY OF THIS METHODOLOGY IS >24 mIU/mL   Wet prep, genital     Status: Abnormal   Collection Time: 11/09/15  7:56 PM  Result Value Ref Range   Yeast Wet Prep HPF POC NONE SEEN NONE SEEN   Trich, Wet Prep NONE SEEN NONE SEEN   Clue Cells Wet Prep HPF  POC NONE SEEN NONE SEEN   WBC, Wet Prep HPF POC FEW (A) NONE SEEN    Comment: MODERATE BACTERIA SEEN   Sperm NONE SEEN   CBC     Status: Abnormal   Collection Time: 11/09/15  8:03 PM  Result Value Ref Range   WBC 7.0 4.0 - 10.5 K/uL   RBC 3.44 (L) 3.87 - 5.11 MIL/uL   Hemoglobin 10.7 (L) 12.0 - 15.0 g/dL   HCT 16.131.1 (L) 09.636.0 - 04.546.0 %   MCV 90.4 78.0 - 100.0 fL   MCH 31.1 26.0 - 34.0 pg   MCHC 34.4 30.0 - 36.0 g/dL   RDW 40.913.0 81.111.5 - 91.415.5 %   Platelets 194 150 - 400 K/uL  hCG, quantitative, pregnancy     Status: Abnormal   Collection Time: 11/09/15  8:03 PM  Result Value Ref Range   hCG, Beta Chain, Quant, S 181,021 (H) <5 mIU/mL    Comment:          GEST. AGE      CONC.  (mIU/mL)   <=1 WEEK        5 - 50     2 WEEKS       50 - 500     3 WEEKS       100 - 10,000     4 WEEKS     1,000 - 30,000     5 WEEKS     3,500 - 115,000   6-8 WEEKS     12,000 - 270,000    12 WEEKS     15,000 - 220,000        FEMALE AND NON-PREGNANT FEMALE:     LESS THAN 5 mIU/mL   ABO/Rh     Status: None   Collection Time: 11/09/15  8:03 PM  Result Value Ref Range   ABO/RH(D) B POS     [place] Ob Comp Less 14 Wks  Result Date: 11/09/2015 CLINICAL DATA:  Initial evaluation for acute pelvic pain for 2 days. Beta HCG pending. EXAM: OBSTETRIC <14 WK US AND TRANSVAGINAL OB US TECHNIQUE: Both transabdominal and transvaginal ultrasound examinations were performed for complete evaluation of the gestation as well as the maternal uterus, adnexal regions, and pelvic cul-de-sac. Transvaginal technique was performed to assess early pregnancy. COMPARISON:  Prior ultrasound from 03/23/2015. FINDINGS: Intrauterine gestational sac: Present and single. Yolk sac:  Present Embryo:  Present Cardiac Activity: Present Heart Rate: 184  bpm MSD:   mm    w     d CRL:  2.16 cm 8 w   5 d                  US EDC: 06/15/2016 Subchorionic hemorrhage:  None visualized. Maternal uterus/adnexae: Normal without acute abnormality. IMPRESSION: 1.  Normal single intrauterine pregnancy as above without complication. 2. No other acute abnormality within the pelvis. Electronically Signed  By: Rise Mu M.D.   On: 11/09/2015 21:00   MDM:  Reviewed today's labwork and [place].  IUP noted on ultrasound ruling out ectopic pregnancy.  Will treat n/v in pregnancy with Rx for Phenergan.  Pt to f/u with early prenatal care.  Pt stable at time of discharge.  Assessment: 1. Normal intrauterine pregnancy on prenatal ultrasound in first trimester   2. Pelvic pain affecting pregnancy in first trimester, antepartum   3. Nausea and vomiting during pregnancy prior to [redacted] weeks gestation     Plan:  D/C home with first trimester precautions Pt to start prenatal care as soon as possible, given a list of area providers Phenergan 12.5-25 mg PO Q 6 hours PRN for nausea, discussed use of OTC doxylamine and B6 and pt given list of safe OTC medications in pregnancy Return to MAU as needed for emergencies  LEFTWICH-KIRBY, Siraj Dermody, CNM 9:30 PM

## 2015-11-09 NOTE — Discharge Instructions (Signed)
[facility]  ° ° °[facility]       Phone: [phone] ° °[facility] [place] Phone: [phone] ° °[facility] [place]  Phone: [phone] ° °[facility] [place]  Phone: [phone] ° °[facility] [place]  Phone: [phone] ° °[facility]       Phone: [phone] ° °[facility]    Phone: [phone]  ° °[facility] ([place])    Phone: [phone] ° °[facility]    Phone: [phone] ° °[facility]    Phone: [phone] ° °[facility]    Phone: [phone] ° °[facility]       Phone: [phone]  ° °[facility]  Phone: [phone] ° °[facility]    Phone: [phone] ° °[facility]   Phone: [phone] ° °[facility]      Phone: [phone] ° °[facility]    Phone: [phone] ° °

## 2015-11-09 NOTE — MAU Note (Signed)
Pt presents to MAU with lower abdominal pain that she describes as "someone is pinching my ovary", nausea/vomitting, and migraines. Began 2 days ago. Has had + home preg test. Pain mostly on right side

## 2015-11-10 LAB — HIV ANTIBODY (ROUTINE TESTING W REFLEX): HIV Screen 4th Generation wRfx: NONREACTIVE

## 2015-11-10 LAB — RPR: RPR Ser Ql: NONREACTIVE

## 2015-11-12 LAB — GC/CHLAMYDIA PROBE AMP (~~LOC~~) NOT AT ARMC
CHLAMYDIA, DNA PROBE: NEGATIVE
Neisseria Gonorrhea: NEGATIVE

## 2015-12-26 ENCOUNTER — Other Ambulatory Visit (HOSPITAL_COMMUNITY)
Admission: RE | Admit: 2015-12-26 | Discharge: 2015-12-26 | Disposition: A | Discharge status: Home / Self Care | Payer: Medicaid Other | Source: Ambulatory Visit | Attending: Family Medicine | Admitting: Family Medicine

## 2015-12-26 ENCOUNTER — Ambulatory Visit (INDEPENDENT_AMBULATORY_CARE_PROVIDER_SITE_OTHER): Payer: Medicaid Other | Admitting: Family Medicine

## 2015-12-26 ENCOUNTER — Encounter: Payer: Self-pay | Admitting: Family Medicine

## 2015-12-26 DIAGNOSIS — Z01419 Encounter for gynecological examination (general) (routine) without abnormal findings: Secondary | ICD-10-CM | POA: Diagnosis not present

## 2015-12-26 DIAGNOSIS — Z113 Encounter for screening for infections with a predominantly sexual mode of transmission: Secondary | ICD-10-CM | POA: Diagnosis present

## 2015-12-26 DIAGNOSIS — Z3482 Encounter for supervision of other normal pregnancy, second trimester: Secondary | ICD-10-CM

## 2015-12-26 DIAGNOSIS — Z124 Encounter for screening for malignant neoplasm of cervix: Secondary | ICD-10-CM

## 2015-12-26 DIAGNOSIS — Z3490 Encounter for supervision of normal pregnancy, unspecified, unspecified trimester: Secondary | ICD-10-CM | POA: Insufficient documentation

## 2015-12-26 NOTE — Patient Instructions (Signed)
Segundo trimestre de embarazo (Second Trimester of Pregnancy) El segundo trimestre va desde la semana13 hasta la 28, desde el cuarto hasta el sexto mes, y suele ser el momento en el que mejor se siente. Su organismo se ha adaptado a estar embarazada y comienza a sentirse fsicamente mejor. En general, las nuseas matutinas han disminuido o han desaparecido completamente, puede tener ms energa y un aumento de apetito. El segundo trimestre es tambin la poca en la que el feto se desarrolla rpidamente. Hacia el final del sexto mes, el feto mide aproximadamente 9pulgadas (23cm) y pesa alrededor de 1 libras (700g). Es probable que sienta que el beb se mueve (da pataditas) entre las 18 y 20semanas del embarazo. CAMBIOS EN EL ORGANISMO Su organismo atraviesa por muchos cambios durante el embarazo, y estos varan de una mujer a otra.  Seguir aumentando de peso. Notar que la parte baja del abdomen sobresale.  Podrn aparecer las primeras estras en las caderas, el abdomen y las mamas.  Es posible que tenga dolores de cabeza que pueden aliviarse con los medicamentos que el mdico le permita tomar.  Tal vez tenga necesidad de orinar con ms frecuencia porque el feto est ejerciendo presin sobre la vejiga.  Debido al embarazo podr sentir acidez estomacal con frecuencia.  Puede estar estreida, ya que ciertas hormonas enlentecen los movimientos de los msculos que empujan los desechos a travs de los intestinos.  Pueden aparecer hemorroides o abultarse e hincharse las venas (venas varicosas).  Puede tener dolor de espalda que se debe al aumento de peso y a que las hormonas del embarazo relajan las articulaciones entre los huesos de la pelvis, y como consecuencia de la modificacin del peso y los msculos que mantienen el equilibrio.  Las mamas seguirn creciendo y le dolern.  Las encas pueden sangrar y estar sensibles al cepillado y al hilo dental.  Pueden aparecer zonas oscuras o  manchas (cloasma, mscara del embarazo) en el rostro que probablemente se atenuar despus del nacimiento del beb.  Es posible que se forme una lnea oscura desde el ombligo hasta la zona del pubis (linea nigra) que probablemente se atenuar despus del nacimiento del beb.  Tal vez haya cambios en el cabello que pueden incluir su engrosamiento, crecimiento rpido y cambios en la textura. Adems, a algunas mujeres se les cae el cabello durante o despus del embarazo, o tienen el cabello seco o fino. Lo ms probable es que el cabello se le normalice despus del nacimiento del beb. QU DEBE ESPERAR EN LAS CONSULTAS PRENATALES Durante una visita prenatal de rutina:  La pesarn para asegurarse de que usted y el feto estn creciendo normalmente.  Le tomarn la presin arterial.  Le medirn el abdomen para controlar el desarrollo del beb.  Se escucharn los latidos cardacos fetales.  Se evaluarn los resultados de los estudios solicitados en visitas anteriores. El mdico puede preguntarle lo siguiente:  Cmo se siente.  Si siente los movimientos del beb.  Si ha tenido sntomas anormales, como prdida de lquido, sangrado, dolores de cabeza intensos o clicos abdominales.  Si est consumiendo algn producto que contenga tabaco, como cigarrillos, tabaco de mascar y cigarrillos electrnicos.  Si tiene alguna pregunta. Otros estudios que podrn realizarse durante el segundo trimestre incluyen lo siguiente:  Anlisis de sangre para detectar lo siguiente:  Concentraciones de hierro bajas (anemia).  Diabetes gestacional (entre la semana 24 y la 28).  Anticuerpos Rh.  Anlisis de orina para detectar infecciones, diabetes o protenas en la orina.    Una ecografa para confirmar que el beb crece y se desarrolla correctamente.  Una amniocentesis para diagnosticar posibles problemas genticos.  Estudios del feto para descartar espina bfida y sndrome de Down.  Prueba del VIH (virus  de inmunodeficiencia humana). Los exmenes prenatales de rutina incluyen la prueba de deteccin del VIH, a menos que decida no realizrsela. INSTRUCCIONES PARA EL CUIDADO EN EL HOGAR  Evite fumar, consumir hierbas, beber alcohol y tomar frmacos que no le hayan recetado. Estas sustancias qumicas afectan la formacin y el desarrollo del beb.  No consuma ningn producto que contenga tabaco, lo que incluye cigarrillos, tabaco de mascar y cigarrillos electrnicos. Si necesita ayuda para dejar de fumar, consulte al mdico. Puede recibir asesoramiento y otro tipo de recursos para dejar de fumar.  Siga las indicaciones del mdico en relacin con el uso de medicamentos. Durante el embarazo, hay medicamentos que son seguros de tomar y otros que no.  Haga ejercicio solamente como se lo haya indicado el mdico. Sentir clicos uterinos es un buen signo para detener la actividad fsica.  Contine comiendo alimentos sanos con regularidad.  Use un sostn que le brinde buen soporte si le duelen las mamas.  No se d baos de inmersin en agua caliente, baos turcos ni saunas.  Use el cinturn de seguridad en todo momento mientras conduce.  No coma carne cruda ni queso sin cocinar; evite el contacto con las bandejas sanitarias de los gatos y la tierra que estos animales usan. Estos elementos contienen grmenes que pueden causar defectos congnitos en el beb.  Tome las vitaminas prenatales.  Tome entre 1500 y 2000mg de calcio diariamente comenzando en la semana20 del embarazo hasta el parto.  Si est estreida, pruebe un laxante suave (si el mdico lo autoriza). Consuma ms alimentos ricos en fibra, como vegetales y frutas frescos y cereales integrales. Beba gran cantidad de lquido para mantener la orina de tono claro o color amarillo plido.  Dese baos de asiento con agua tibia para aliviar el dolor o las molestias causadas por las hemorroides. Use una crema para las hemorroides si el mdico la  autoriza.  Si tiene venas varicosas, use medias de descanso. Eleve los pies durante 15minutos, 3 o 4veces por da. Limite el consumo de sal en su dieta.  No levante objetos pesados, use zapatos de tacones bajos y mantenga una buena postura.  Descanse con las piernas elevadas si tiene calambres o dolor de cintura.  Visite a su dentista si an no lo ha hecho durante el embarazo. Use un cepillo de dientes blando para higienizarse los dientes y psese el hilo dental con suavidad.  Puede seguir manteniendo relaciones sexuales, a menos que el mdico le indique lo contrario.  Concurra a todas las visitas prenatales segn las indicaciones de su mdico. SOLICITE ATENCIN MDICA SI:  Tiene mareos.  Siente clicos leves, presin en la pelvis o dolor persistente en el abdomen.  Tiene nuseas, vmitos o diarrea persistentes.  Observa una secrecin vaginal con mal olor.  Siente dolor al orinar. SOLICITE ATENCIN MDICA DE INMEDIATO SI:  Tiene fiebre.  Tiene una prdida de lquido por la vagina.  Tiene sangrado o pequeas prdidas vaginales.  Siente dolor intenso o clicos en el abdomen.  Sube o baja de peso rpidamente.  Tiene dificultad para respirar y siente dolor de pecho.  Sbitamente se le hinchan mucho el rostro, las manos, los tobillos, los pies o las piernas.  No ha sentido los movimientos del beb durante una hora.  Siente un   dolor de cabeza intenso que no se alivia con medicamentos.  Su visin se modifica. Esta informacin no tiene como fin reemplazar el consejo del mdico. Asegrese de hacerle al mdico cualquier pregunta que tenga. Document Released: 10/23/2004 Document Revised: 02/03/2014 Document Reviewed: 03/16/2012 Elsevier Interactive Patient Education  2017 [employer]   Lactancia materna (Breastfeeding) Decidir amamantar es una de las mejores elecciones que puede hacer por usted y su beb. El cambio hormonal durante el embarazo produce el desarrollo del  tejido mamario y aumenta la cantidad y el tamao de los conductos galactforos. Estas hormonas tambin permiten que las protenas, los azcares y las grasas de la sangre produzcan la leche materna en las glndulas productoras de leche. Las hormonas impiden que la leche materna sea liberada antes del nacimiento del beb, adems de impulsar el flujo de leche luego del nacimiento. Una vez que ha comenzado a amamantar, pensar en el beb, as como la succin o el llanto, pueden estimular la liberacin de leche de las glndulas productoras de leche. LOS BENEFICIOS DE AMAMANTAR Para el beb   La primera leche (calostro) ayuda a mejorar el funcionamiento del sistema digestivo del beb.  La leche tiene anticuerpos que ayudan a prevenir las infecciones en el beb.  El beb tiene una menor incidencia de asma, alergias y del sndrome de muerte sbita del lactante.  Los nutrientes en la leche materna son mejores para el beb que la leche maternizada y estn preparados exclusivamente para cubrir las necesidades del beb.  La leche materna mejora el desarrollo cerebral del beb.  Es menos probable que el beb desarrolle otras enfermedades, como obesidad infantil, asma o diabetes mellitus de tipo 2. Para usted   La lactancia materna favorece el desarrollo de un vnculo muy especial entre la madre y el beb.  Es conveniente. La leche materna siempre est disponible a la temperatura correcta y es econmica.  La lactancia materna ayuda a quemar caloras y a perder el peso ganado durante el embarazo.  Favorece la contraccin del tero al tamao que tena antes del embarazo de manera ms rpida y disminuye el sangrado (loquios) despus del parto.  La lactancia materna contribuye a reducir el riesgo de desarrollar diabetes mellitus de tipo 2, osteoporosis o cncer de mama o de ovario en el futuro. SIGNOS DE QUE EL BEB EST HAMBRIENTO Primeros signos de hambre   Aumenta su estado de alerta o actividad.  Se  estira.  Mueve la cabeza de un lado a otro.  Mueve la cabeza y abre la boca cuando se le toca la mejilla o la comisura de la boca (reflejo de bsqueda).  Aumenta las vocalizaciones, tales como sonidos de succin, se relame los labios, emite arrullos, suspiros, o chirridos.  Mueve la mano hacia la boca.  Se chupa con ganas los dedos o las manos. Signos tardos de hambre   Est agitado.  Llora de manera intermitente. Signos de hambre extrema  Los signos de hambre extrema requerirn que lo calme y lo consuele antes de que el beb pueda alimentarse adecuadamente. No espere a que se manifiesten los siguientes signos de hambre extrema para comenzar a amamantar:  Agitacin.  Llanto intenso y fuerte.  Gritos. INFORMACIN BSICA SOBRE LA LACTANCIA MATERNA Iniciacin de la lactancia materna   Encuentre un lugar cmodo para sentarse o acostarse, con un buen respaldo para el cuello y la espalda.  Coloque una almohada o una manta enrollada debajo del beb para acomodarlo a la altura de la mama (si est sentada).   Las almohadas para amamantar se han diseado especialmente a fin de servir de apoyo para los brazos y el beb mientras amamanta.  Asegrese de que el abdomen del beb est frente al suyo.  Masajee suavemente la mama. Con las yemas de los dedos, masajee la pared del pecho hacia el pezn en un movimiento circular. Esto estimula el flujo de leche. Es posible que deba continuar este movimiento mientras amamanta si la leche fluye lentamente.  Sostenga la mama con el pulgar por arriba del pezn y los otros 4 dedos por debajo de la mama. Asegrese de que los dedos se encuentren lejos del pezn y de la boca del beb.  Empuje suavemente los labios del beb con el pezn o con el dedo.  Cuando la boca del beb se abra lo suficiente, acrquelo rpidamente a la mama e introduzca todo el pezn y la zona oscura que lo rodea (areola), tanto como sea posible, dentro de la boca del beb.  Debe  haber ms areola visible por arriba del labio superior del beb que por debajo del labio inferior.  La lengua del beb debe estar entre la enca inferior y la mama.  Asegrese de que la boca del beb est en la posicin correcta alrededor del pezn (prendida). Los labios del beb deben crear un sello sobre la mama y estar doblados hacia afuera (invertidos).  Es comn que el beb succione durante 2 a 3 minutos para que comience el flujo de leche materna. Cmo debe prenderse  Es muy importante que le ensee al beb cmo prenderse adecuadamente a la mama. Si el beb no se prende adecuadamente, puede causarle dolor en el pezn y reducir la produccin de leche materna, y hacer que el beb tenga un escaso aumento de peso. Adems, si el beb no se prende adecuadamente al pezn, puede tragar aire durante la alimentacin. Esto puede causarle molestias al beb. Hacer eructar al beb al cambiar de mama puede ayudarlo a liberar el aire. Sin embargo, ensearle al beb cmo prenderse a la mama adecuadamente es la mejor manera de evitar que se sienta molesto por tragar aire mientras se alimenta. Signos de que el beb se ha prendido adecuadamente al pezn:  Tironea o succiona de modo silencioso, sin causarle dolor.  Se escucha que traga cada 3 o 4 succiones.  Hay movimientos musculares por arriba y por delante de sus odos al succionar. Signos de que el beb no se ha prendido adecuadamente al pezn:  Hace ruidos de succin o de chasquido mientras se alimenta.  Siente dolor en el pezn. Si cree que el beb no se prendi correctamente, deslice el dedo en la comisura de la boca y colquelo entre las encas del beb para interrumpir la succin. Intente comenzar a amamantar nuevamente. Signos de lactancia materna exitosa  Signos del beb:  Disminuye gradualmente el nmero de succiones o cesa la succin por completo.  Se duerme.  Relaja el cuerpo.  Retiene una pequea cantidad de leche en la boca.  Se  desprende solo del pecho. Signos que presenta usted:  Las mamas han aumentado la firmeza, el peso y el tamao 1 a 3 horas despus de amamantar.  Estn ms blandas inmediatamente despus de amamantar.  Un aumento del volumen de leche, y tambin un cambio en su consistencia y color se producen hacia el quinto da de lactancia materna.  Los pezones no duelen, ni estn agrietados ni sangran. Signos de que su beb recibe la cantidad de leche suficiente   Mojar   por lo menos 1 o 2 paales durante las primeras 24 horas despus del nacimiento.  Mojar por lo menos 5 o 6 paales cada 24 horas durante la primera semana despus del nacimiento. La orina debe ser transparente o de color amarillo plido a los 5 das despus del nacimiento.  Mojar entre 6 y 8 paales cada 24 horas a medida que el beb sigue creciendo y desarrollndose.  Defeca al menos 3 veces en 24 horas a los 5 das de vida. La materia fecal debe ser blanda y amarillenta.  Defeca al menos 3 veces en 24 horas a los 7 das de vida. La materia fecal debe ser grumosa y amarillenta.  No registra una prdida de peso mayor del 10% del peso al nacer durante los primeros 3 das de vida.  Aumenta de peso un promedio de 4 a 7onzas (113 a 198g) por semana despus de los 4 das de vida.  Aumenta de peso, diariamente, de manera uniforme a partir de los 5 das de vida, sin registrar prdida de peso despus de las 2semanas de vida. Despus de alimentarse, es posible que el beb regurgite una pequea cantidad. Esto es frecuente. FRECUENCIA Y DURACIN DE LA LACTANCIA MATERNA El amamantamiento frecuente la ayudar a producir ms leche y a prevenir problemas de dolor en los pezones e hinchazn en las mamas. Alimente al beb cuando muestre signos de hambre o si siente la necesidad de reducir la congestin de las mamas. Esto se denomina "lactancia a demanda". Evite el uso del chupete mientras trabaja para establecer la lactancia (las primeras 4 a 6  semanas despus del nacimiento del beb). Despus de este perodo, podr ofrecerle un chupete. Las investigaciones demostraron que el uso del chupete durante el primer ao de vida del beb disminuye el riesgo de desarrollar el sndrome de muerte sbita del lactante (SMSL). Permita que el nio se alimente en cada mama todo lo que desee. Contine amamantando al beb hasta que haya terminado de alimentarse. Cuando el beb se desprende o se queda dormido mientras se est alimentando de la primera mama, ofrzcale la segunda. Debido a que, con frecuencia, los recin nacidos permanecen somnolientos las primeras semanas de vida, es posible que deba despertar al beb para alimentarlo. Los horarios de lactancia varan de un beb a otro. Sin embargo, las siguientes reglas pueden servir como gua para ayudarla a garantizar que el beb se alimenta adecuadamente:  Se puede amamantar a los recin nacidos (bebs de 4 semanas o menos de vida) cada 1 a 3 horas.  No deben transcurrir ms de 3 horas durante el da o 5 horas durante la noche sin que se amamante a los recin nacidos.  Debe amamantar al beb 8 veces como mnimo en un perodo de 24 horas, hasta que comience a introducir slidos en su dieta, a los 6 meses de vida aproximadamente. EXTRACCIN DE LECHE MATERNA La extraccin y el almacenamiento de la leche materna le permiten asegurarse de que el beb se alimente exclusivamente de leche materna, aun en momentos en los que no puede amamantar. Esto tiene especial importancia si debe regresar al trabajo en el perodo en que an est amamantando o si no puede estar presente en los momentos en que el beb debe alimentarse. Su asesor en lactancia puede orientarla sobre cunto tiempo es seguro almacenar leche materna. El sacaleche es un aparato que le permite extraer leche de la mama a un recipiente estril. Luego, la leche materna extrada puede almacenarse en un refrigerador o congelador.   Algunos sacaleches son manuales,  mientras que otros son elctricos. Consulte a su asesor en lactancia qu tipo ser ms conveniente para usted. Los sacaleches se pueden comprar; sin embargo, algunos hospitales y grupos de apoyo a la lactancia materna alquilan sacaleches mensualmente. Un asesor en lactancia puede ensearle cmo extraer leche materna manualmente, en caso de que prefiera no usar un sacaleche. CMO CUIDAR LAS MAMAS DURANTE LA LACTANCIA MATERNA Los pezones se secan, agrietan y duelen durante la lactancia materna. Las siguientes recomendaciones pueden ayudarla a mantener las mamas humectadas y sanas:  Evite usar jabn en los pezones.  Use un sostn de soporte. Aunque no son esenciales, las camisetas sin mangas o los sostenes especiales para amamantar estn diseados para acceder fcilmente a las mamas, para amamantar sin tener que quitarse todo el sostn o la camiseta. Evite usar sostenes con aro o sostenes muy ajustados.  Seque al aire sus pezones durante 3 a 4minutos despus de amamantar al beb.  Utilice solo apsitos de algodn en el sostn para absorber las prdidas de leche. La prdida de un poco de leche materna entre las tomas es normal.  Utilice lanolina sobre los pezones luego de amamantar. La lanolina ayuda a mantener la humedad normal de la piel. Si usa lanolina pura, no tiene que lavarse los pezones antes de volver a alimentar al beb. La lanolina pura no es txica para el beb. Adems, puede extraer manualmente algunas gotas de leche materna y masajear suavemente esa leche sobre los pezones, para que la leche se seque al aire. Durante las primeras semanas despus de dar a luz, algunas mujeres pueden experimentar hinchazn en las mamas (congestin mamaria). La congestin puede hacer que sienta las mamas pesadas, calientes y sensibles al tacto. El pico de la congestin ocurre dentro de los 3 a 5 das despus del parto. Las siguientes recomendaciones pueden ayudarla a aliviar la congestin:  Vace por completo  las mamas al amamantar o extraer leche. Puede aplicar calor hmedo en las mamas (en la ducha o con toallas hmedas para manos) antes de amamantar o extraer leche. Esto aumenta la circulacin y ayuda a que la leche fluya. Si el beb no vaca por completo las mamas cuando lo amamanta, extraiga la leche restante despus de que haya finalizado.  Use un sostn ajustado (para amamantar o comn) o una camiseta sin mangas durante 1 o 2 das para indicar al cuerpo que disminuya ligeramente la produccin de leche.  Aplique compresas de hielo sobre las mamas, a menos que le resulte demasiado incmodo.  Asegrese de que el beb est prendido y se encuentre en la posicin correcta mientras lo alimenta. Si la congestin persiste luego de 48 horas o despus de seguir estas recomendaciones, comunquese con su mdico o un asesor en lactancia. RECOMENDACIONES GENERALES PARA EL CUIDADO DE LA SALUD DURANTE LA LACTANCIA MATERNA  Consuma alimentos saludables. Alterne comidas y colaciones, y coma 3 de cada una por da. Dado que lo que come afecta la leche materna, es posible que algunas comidas hagan que su beb se vuelva ms irritable de lo habitual. Evite comer este tipo de alimentos si percibe que afectan de manera negativa al beb.  Beba leche, jugos de fruta y agua para satisfacer su sed (aproximadamente 10 vasos al da).  Descanse con frecuencia, reljese y tome sus vitaminas prenatales para evitar la fatiga, el estrs y la anemia.  Contine con los autocontroles de la mama.  Evite masticar y fumar tabaco. Las sustancias qumicas de los cigarrillos que pasan   a la leche materna y la exposicin al humo ambiental del tabaco pueden daar al beb.  No consuma alcohol ni drogas, incluida la marihuana. Algunos medicamentos, que pueden ser perjudiciales para el beb, pueden pasar a travs de la leche materna. Es importante que consulte a su mdico antes de tomar cualquier medicamento, incluidos todos los medicamentos  recetados y de venta libre, as como los suplementos vitamnicos y herbales. Puede quedar embarazada durante la lactancia. Si desea controlar la natalidad, consulte a su mdico cules son las opciones ms seguras para el beb. SOLICITE ATENCIN MDICA SI:  Usted siente que quiere dejar de amamantar o se siente frustrada con la lactancia.  Siente dolor en las mamas o en los pezones.  Sus pezones estn agrietados o sangran.  Sus pechos estn irritados, sensibles o calientes.  Tiene un rea hinchada en cualquiera de las mamas.  Siente escalofros o fiebre.  Tiene nuseas o vmitos.  Presenta una secrecin de otro lquido distinto de la leche materna de los pezones.  Sus mamas no se llenan antes de amamantar al beb para el quinto da despus del parto.  Se siente triste y deprimida.  El beb est demasiado somnoliento como para comer bien.  El beb tiene problemas para dormir.  Moja menos de 3 paales en 24 horas.  Defeca menos de 3 veces en 24 horas.  La piel del beb o la parte blanca de los ojos se vuelven amarillentas.  El beb no ha aumentado de peso a los 5 das de vida. SOLICITE ATENCIN MDICA DE INMEDIATO SI:  El beb est muy cansado (letargo) y no se quiere despertar para comer.  Le sube la fiebre sin causa. Esta informacin no tiene como fin reemplazar el consejo del mdico. Asegrese de hacerle al mdico cualquier pregunta que tenga. Document Released: 01/13/2005 Document Revised: 05/07/2015 Document Reviewed: 07/07/2012 Elsevier Interactive Patient Education  2017 [employer]  

## 2015-12-26 NOTE — Progress Notes (Signed)
    Subjective:    Gloria LimboKaren Nunez is a [id number] 8435w3d being seen today for her first obstetrical visit.  Her obstetrical history is not significant. Patient does intend to breast feed. Pregnancy history fully reviewed.  Patient reports nausea, vomiting and dizziness.  Vitals:   12/26/15 1408  BP: (!) 93/55  Pulse: 84  Weight: 102 lb 9.6 oz (46.5 kg)    HISTORY: OB History  Gravida Para Term Preterm AB Living  4 2 2   1 2   SAB TAB Ectopic Multiple Live Births  1     0 2    # Outcome Date GA Lbr Len/2nd Weight Sex Delivery Anes PTL Lv  4 Current           3 Term 09/09/14 6142w0d 12:52 / 00:13 6 lb 10.2 oz (3.011 kg) M Vag-Spont None  LIV  2 Term 12/03/12 2391w0d   M Vag-Spont   LIV  1 SAB  5037w0d            Past Medical History:  Diagnosis Date  . Medical history non-contributory   . Pregnant    Past Surgical History:  Procedure Laterality Date  . NO PAST SURGERIES     Family History  Problem Relation Age of Onset  . Diabetes Maternal Grandfather   . Heart disease Maternal Grandfather      Exam    Uterus:     Pelvic Exam:    Perineum: Normal Perineum   Vulva: Bartholin's, Urethra, Skene's normal   Vagina:  normal mucosa, normal discharge   Cervix: multiparous appearance and yellow discharge   Adnexa: normal adnexa   Bony Pelvis: average  System: Breast:  normal appearance, no masses or tenderness   Skin: normal coloration and turgor, no rashes    Neurologic: normal mood   Extremities: normal strength, tone, and muscle mass   HEENT extra ocular movement intact, sclera clear, anicteric, oropharynx clear, no lesions and neck supple with midline trachea   Mouth/Teeth mucous membranes moist, pharynx normal without lesions   Neck supple   Cardiovascular: regular rate and rhythm, no murmurs or gallops   Respiratory:  appears well, vitals normal, no respiratory distress, acyanotic, normal RR, ear and throat exam is normal, neck free of mass or lymphadenopathy,  chest clear, no wheezing, crepitations, rhonchi, normal symmetric air entry   Abdomen: soft, non-tender; bowel sounds normal; no masses,  no organomegaly      Assessment/Plan:   1. Encounter for supervision of other normal pregnancy in second trimester Begin routine care Baby Rx patient - Pain Mgmt, Profile 6 Conf w/o mM, U - Hemoglobinopathy Evaluation - Prenatal Profile - Culture, OB Urine - AFP, Quad Screen - Cytology - PAP - GC/Chlamydia probe amp ([place] - US MFM OB COMP + 14 WK; Future-anatomy   Reva Boresanya S Florena Kozma 12/26/2015

## 2015-12-27 LAB — PRENATAL PROFILE (SOLSTAS)
ANTIBODY SCREEN: NEGATIVE
BASOS PCT: 0 %
Basophils Absolute: 0 cells/uL (ref 0–200)
EOS ABS: 68 {cells}/uL (ref 15–500)
EOS PCT: 1 %
HEMATOCRIT: 34.8 % — AB (ref 35.0–45.0)
HEMOGLOBIN: 11.3 g/dL — AB (ref 11.7–15.5)
HIV 1&2 Ab, 4th Generation: NONREACTIVE
Hepatitis B Surface Ag: NEGATIVE
LYMPHS PCT: 14 %
Lymphs Abs: 952 cells/uL (ref 850–3900)
MCH: 31.7 pg (ref 27.0–33.0)
MCHC: 32.5 g/dL (ref 32.0–36.0)
MCV: 97.8 fL (ref 80.0–100.0)
MONOS PCT: 5 %
MPV: 10.6 fL (ref 7.5–12.5)
Monocytes Absolute: 340 cells/uL (ref 200–950)
NEUTROS ABS: 5440 {cells}/uL (ref 1500–7800)
Neutrophils Relative %: 80 %
Platelets: 222 10*3/uL (ref 140–400)
RBC: 3.56 MIL/uL — AB (ref 3.80–5.10)
RDW: 14.1 % (ref 11.0–15.0)
RH TYPE: POSITIVE
Rubella: 1.36 Index — ABNORMAL HIGH (ref <0.90)
WBC: 6.8 10*3/uL (ref 3.8–10.8)

## 2015-12-27 LAB — CULTURE, OB URINE: Organism ID, Bacteria: NO GROWTH

## 2015-12-27 LAB — GC/CHLAMYDIA PROBE AMP (~~LOC~~) NOT AT ARMC
CHLAMYDIA, DNA PROBE: NEGATIVE
Neisseria Gonorrhea: NEGATIVE

## 2015-12-28 LAB — AFP, QUAD SCREEN
AFP: 66.4 ng/mL
Curr Gest Age: 15.4 weeks
HCG TOTAL: 77.63 [IU]/mL
INH: 198.4 pg/mL
Interpretation-AFP: NEGATIVE
MOM FOR AFP: 1.78
MOM FOR HCG: 1.61
MOM FOR INH: 1.18
Open Spina bifida: NEGATIVE
Tri 18 Scr Risk Est: NEGATIVE
Trisomy 18 (Edward) Syndrome Interp.: 1:122000 {titer}
UE3 MOM: 0.81
uE3 Value: 0.81 ng/mL

## 2015-12-31 LAB — CYTOLOGY - PAP: Diagnosis: NEGATIVE

## 2015-12-31 LAB — HEMOGLOBINOPATHY EVALUATION
HCT: 34.8 % — ABNORMAL LOW (ref 35.0–45.0)
HGB A2 QUANT: 2.4 % (ref 1.8–3.5)
Hemoglobin: 11.3 g/dL — ABNORMAL LOW (ref 11.7–15.5)
Hgb A: 96.6 % (ref >96.0)
MCH: 31.7 pg (ref 27.0–33.0)
MCV: 97.8 fL (ref 80.0–100.0)
RBC: 3.56 MIL/uL — ABNORMAL LOW (ref 3.80–5.10)
RDW: 14.1 % (ref 11.0–15.0)

## 2016-01-02 LAB — PAIN MGMT, PROFILE 6 CONF W/O MM, U
6 Acetylmorphine: NEGATIVE ng/mL (ref <10)
ALCOHOL METABOLITES: NEGATIVE ng/mL (ref <500)
Amphetamines: NEGATIVE ng/mL (ref <500)
BENZODIAZEPINES: NEGATIVE ng/mL (ref <100)
Barbiturates: NEGATIVE ng/mL (ref <300)
CREATININE: 162 mg/dL (ref >=20.0)
Cocaine Metabolite: NEGATIVE ng/mL (ref <150)
MARIJUANA METABOLITE: NEGATIVE ng/mL (ref <20)
Methadone Metabolite: NEGATIVE ng/mL (ref <100)
OPIATES: NEGATIVE ng/mL (ref <100)
OXIDANT: NEGATIVE ug/mL (ref <200)
Oxycodone: NEGATIVE ng/mL (ref <100)
Phencyclidine: NEGATIVE ng/mL (ref <25)
Please note:: 0
pH: 7.14 (ref 4.5–9.0)

## 2016-01-23 ENCOUNTER — Ambulatory Visit (HOSPITAL_COMMUNITY)
Admission: RE | Admit: 2016-01-23 | Discharge: 2016-01-23 | Disposition: A | Discharge status: Home / Self Care | Payer: Medicaid Other | Source: Ambulatory Visit | Attending: Family Medicine | Admitting: Family Medicine

## 2016-01-23 ENCOUNTER — Encounter: Payer: Medicaid Other | Admitting: Advanced Practice Midwife

## 2016-01-23 DIAGNOSIS — Z3A19 19 weeks gestation of pregnancy: Secondary | ICD-10-CM | POA: Diagnosis not present

## 2016-01-23 DIAGNOSIS — Z363 Encounter for antenatal screening for malformations: Secondary | ICD-10-CM | POA: Diagnosis present

## 2016-01-23 DIAGNOSIS — Z3482 Encounter for supervision of other normal pregnancy, second trimester: Secondary | ICD-10-CM

## 2016-01-25 ENCOUNTER — Encounter: Payer: Medicaid Other | Admitting: Advanced Practice Midwife

## 2016-01-28 NOTE — L&D Delivery Note (Signed)
Delivery Note At 1:21 PM a viable female was delivered via  (Presentation: ROA).  APGAR: 9, 9; weight pending.   Placenta status: Delivered intact with gentle traction.  Cord: 3 vessels  with the following complications: none .  Cord pH: Not collected  Anesthesia: Epidural Episiotomy:  None  Lacerations:  None Suture Repair: N/A Est. Blood Loss (mL): 150  Mom to postpartum.  Baby to Couplet care / Skin to Skin.  Lovena NeighboursAbdoulaye Diallo, PGY-1 06/02/2016, 1:59 PM  Midwife attestation: I was gloved and present for delivery in its entirety and I agree with the above resident's note.  Donette LarryMelanie Breylin Dom, CNM 2:43 PM

## 2016-02-01 NOTE — Progress Notes (Signed)
Note made to schedule at her visit next week.

## 2016-02-05 ENCOUNTER — Encounter: Payer: Medicaid Other | Admitting: Advanced Practice Midwife

## 2016-02-06 ENCOUNTER — Ambulatory Visit (INDEPENDENT_AMBULATORY_CARE_PROVIDER_SITE_OTHER): Payer: Medicaid Other | Admitting: Obstetrics and Gynecology

## 2016-02-06 VITALS — BP 101/53 | HR 84 | Wt 107.6 lb

## 2016-02-06 DIAGNOSIS — O26899 Other specified pregnancy related conditions, unspecified trimester: Secondary | ICD-10-CM

## 2016-02-06 DIAGNOSIS — O26892 Other specified pregnancy related conditions, second trimester: Secondary | ICD-10-CM

## 2016-02-06 DIAGNOSIS — R102 Pelvic and perineal pain: Secondary | ICD-10-CM

## 2016-02-06 DIAGNOSIS — Z23 Encounter for immunization: Secondary | ICD-10-CM | POA: Diagnosis not present

## 2016-02-06 DIAGNOSIS — Z1389 Encounter for screening for other disorder: Secondary | ICD-10-CM

## 2016-02-06 DIAGNOSIS — Z3402 Encounter for supervision of normal first pregnancy, second trimester: Secondary | ICD-10-CM

## 2016-02-06 NOTE — Progress Notes (Signed)
Pt feels pelvic pressure.

## 2016-02-06 NOTE — Patient Instructions (Signed)
Round Ligament Pain Introduction The round ligament is a cord of muscle and tissue that helps to support the uterus. It can become a source of pain during pregnancy if it becomes stretched or twisted as the baby grows. The pain usually begins in the second trimester of pregnancy, and it can come and go until the baby is delivered. It is not a serious problem, and it does not cause harm to the baby. Round ligament pain is usually a short, sharp, and pinching pain, but it can also be a dull, lingering, and aching pain. The pain is felt in the lower side of the abdomen or in the groin. It usually starts deep in the groin and moves up to the outside of the hip area. Pain can occur with:  A sudden change in position.  Rolling over in bed.  Coughing or sneezing.  Physical activity. Follow these instructions at home: Watch your condition for any changes. Take these steps to help with your pain:  When the pain starts, relax. Then try:  Sitting down.  Flexing your knees up to your abdomen.  Lying on your side with one pillow under your abdomen and another pillow between your legs.  Sitting in a warm bath for 15-20 minutes or until the pain goes away.  Take over-the-counter and prescription medicines only as told by your health care provider.  Move slowly when you sit and stand.  Avoid long walks if they cause pain.  Stop or lessen your physical activities if they cause pain. Contact a health care provider if:  Your pain does not go away with treatment.  You feel pain in your back that you did not have before.  Your medicine is not helping. Get help right away if:  You develop a fever or chills.  You develop uterine contractions.  You develop vaginal bleeding.  You develop nausea or vomiting.  You develop diarrhea.  You have pain when you urinate. This information is not intended to replace advice given to you by your health care provider. Make sure you discuss any questions  you have with your health care provider. Document Released: 10/23/2007 Document Revised: 06/21/2015 Document Reviewed: 03/22/2014  2017 Elsevier  

## 2016-02-06 NOTE — Progress Notes (Signed)
   PRENATAL VISIT NOTE  Subjective:  Gloria Nunez is a 21 y.o. [phone] at 4434w3d being seen today for ongoing prenatal care.  She is currently monitored for the following issues for this low-risk pregnancy and has Language barrier; Choroid plexus cyst of fetus; and Supervision of normal pregnancy on her problem list.  Patient reports Feeling pressure in her vagina. Pain is worse when she is moving or walking. .  Contractions: Not present. Vag. Bleeding: None.  Movement: Present. Denies leaking of fluid.   The following portions of the patient's history were reviewed and updated as appropriate: allergies, current medications, past family history, past medical history, past social history, past surgical history and problem list. Problem list updated.  Objective:   Vitals:   02/06/16 0846  BP: (!) 101/53  Pulse: 84  Weight: 107 lb 9.6 oz (48.8 kg)    Fetal Status: Fetal Heart Rate (bpm): 140   Movement: Present     General:  Alert, oriented and cooperative. Patient is in no acute distress.  Skin: Skin is warm and dry. No rash noted.   Cardiovascular: Normal heart rate noted  Respiratory: Normal respiratory effort, no problems with respiration noted  Abdomen: Soft, gravid, appropriate for gestational age. Pain/Pressure: Present     Pelvic:  Cervical exam performed Dilation: Closed Effacement (%): Thick Station: -3  Extremities: Normal range of motion.  Edema: None  Mental Status: Normal mood and affect. Normal behavior. Normal judgment and thought content.   Assessment and Plan:  Pregnancy: [id number] at 5934w3d  1. Encounter for routine screening for malformation using ultrasonics  - US MFM OB FOLLOW UP; Future  2. Pain of round ligament affecting pregnancy, antepartum - normalcy of pregnancy, encouraged pregnancy support belt.   3. Encounter for supervision of normal first pregnancy in second trimester  -Flu shot today   Preterm labor symptoms and general obstetric  precautions including but not limited to vaginal bleeding, contractions, leaking of fluid and fetal movement were reviewed in detail with the patient. Please refer to After Visit Summary for other counseling recommendations.  No Follow-up on file.   Duane LopeJennifer I Nadie Fiumara, NP

## 2016-02-21 ENCOUNTER — Other Ambulatory Visit: Payer: Self-pay | Admitting: Obstetrics and Gynecology

## 2016-02-21 ENCOUNTER — Ambulatory Visit (HOSPITAL_COMMUNITY)
Admission: RE | Admit: 2016-02-21 | Discharge: 2016-02-21 | Disposition: A | Discharge status: Home / Self Care | Payer: Medicaid Other | Source: Ambulatory Visit | Attending: Obstetrics and Gynecology | Admitting: Obstetrics and Gynecology

## 2016-02-21 DIAGNOSIS — Z1389 Encounter for screening for other disorder: Secondary | ICD-10-CM | POA: Insufficient documentation

## 2016-02-21 DIAGNOSIS — Z3A23 23 weeks gestation of pregnancy: Secondary | ICD-10-CM

## 2016-03-04 ENCOUNTER — Ambulatory Visit (INDEPENDENT_AMBULATORY_CARE_PROVIDER_SITE_OTHER): Payer: Medicaid Other | Admitting: Medical

## 2016-03-04 VITALS — BP 108/53 | HR 73 | Wt 108.2 lb

## 2016-03-04 DIAGNOSIS — Z3482 Encounter for supervision of other normal pregnancy, second trimester: Secondary | ICD-10-CM

## 2016-03-04 NOTE — Progress Notes (Signed)
   PRENATAL VISIT NOTE  Subjective:  Gloria Nunez is a 21 y.o. [phone] at 6380w2d being seen today for ongoing prenatal care.  She is currently monitored for the following issues for this low-risk pregnancy and has Language barrier; Choroid plexus cyst of fetus; Supervision of normal pregnancy; and Pain of round ligament affecting pregnancy, antepartum on her problem list.  Patient reports backache.  Contractions: Irritability. Vag. Bleeding: None.  Movement: Present. Denies leaking of fluid.   The following portions of the patient's history were reviewed and updated as appropriate: allergies, current medications, past family history, past medical history, past social history, past surgical history and problem list. Problem list updated.  Objective:   Vitals:   03/04/16 1034  BP: (!) 108/53  Pulse: 73  Weight: 108 lb 3.2 oz (49.1 kg)    Fetal Status: Fetal Heart Rate (bpm): 158 Fundal Height: 24 cm Movement: Present     General:  Alert, oriented and cooperative. Patient is in no acute distress.  Skin: Skin is warm and dry. No rash noted.   Cardiovascular: Normal heart rate noted  Respiratory: Normal respiratory effort, no problems with respiration noted  Abdomen: Soft, gravid, appropriate for gestational age. Pain/Pressure: Present     Pelvic:  Cervical exam deferred        Extremities: Normal range of motion.  Edema: None  Mental Status: Normal mood and affect. Normal behavior. Normal judgment and thought content.   Assessment and Plan:  Pregnancy: [id number] at 1380w2d  1. Encounter for supervision of other normal pregnancy in second trimester - US MFM OB FOLLOW UP; scheduled (limited views of face on last US, patient desires repeated attempt)  - Discussed 28 weeks labs for next visit  2. Round ligament pain, second trimester  - Discussed use of Tylenol, warm bath/shower, heat for back, use of abdominal binder and moderation of activity for pain  Preterm labor symptoms and  general obstetric precautions including but not limited to vaginal bleeding, contractions, leaking of fluid and fetal movement were reviewed in detail with the patient. Please refer to After Visit Summary for other counseling recommendations.  Return in about 3 weeks (around 03/25/2016) for LOB.   Marny LowensteinJulie N Wenzel, PA-C

## 2016-03-04 NOTE — Patient Instructions (Signed)
Segundo trimestre de embarazo (Second Trimester of Pregnancy) El segundo trimestre va desde la semana13 hasta la 28, desde el cuarto hasta el sexto mes, y suele ser el momento en el que mejor se siente. En general, las nuseas matutinas han disminuido o han desaparecido completamente. Tendr ms energa y podr aumentarle el apetito. El beb por nacer (feto) se desarrolla rpidamente. Hacia el final del sexto mes, el beb mide aproximadamente 9 pulgadas (23 cm) y pesa alrededor de 1 libras (700 g). Es probable que sienta al beb moverse (dar pataditas) entre las 18 y 20 semanas del embarazo. CUIDADOS EN EL HOGAR  No fume, no consuma hierbas ni beba alcohol. No tome frmacos que el mdico no haya autorizado.  No consuma ningn producto que contenga tabaco, lo que incluye cigarrillos, tabaco de mascar o cigarrillos electrnicos. Si necesita ayuda para dejar de fumar, consulte al mdico. Puede recibir asesoramiento u otro tipo de apoyo para dejar de fumar.  Tome los medicamentos solamente como se lo haya indicado el mdico. Algunos medicamentos son seguros para tomar durante el embarazo y otros no lo son.  Haga ejercicios solamente como se lo haya indicado el mdico. Interrumpa la actividad fsica si comienza a tener calambres.  Ingiera alimentos saludables de manera regular.  Use un sostn que le brinde buen soporte si sus mamas estn sensibles.  No se d baos de inmersin en agua caliente, baos turcos ni saunas.  Colquese el cinturn de seguridad cuando conduzca.  No coma carne cruda ni queso sin cocinar; evite el contacto con las bandejas sanitarias de los gatos y la tierra que estos animales usan.  Tome las vitaminas prenatales.  Tome entre 1500 y 2000mg de calcio diariamente comenzando en la semana20 del embarazo hasta el parto.  Pruebe tomar un medicamento que la ayude a defecar (un laxante suave) si el mdico lo autoriza. Consuma ms fibra, que se encuentra en las frutas y  verduras frescas y los cereales integrales. Beba suficiente lquido para mantener el pis (orina) claro o de color amarillo plido.  Dese baos de asiento con agua tibia para aliviar el dolor o las molestias causadas por las hemorroides. Use una crema para las hemorroides si el mdico la autoriza.  Si se le hinchan las venas (venas varicosas), use medias de descanso. Levante (eleve) los pies durante 15minutos, 3 o 4veces por da. Limite el consumo de sal en su dieta.  No levante objetos pesados, use zapatos de tacones bajos y sintese derecha.  Descanse con las piernas elevadas si tiene calambres o dolor de cintura.  Visite a su dentista si no lo ha hecho durante el embarazo. Use un cepillo de cerdas suaves para cepillarse los dientes. Psese el hilo dental con suavidad.  Puede seguir manteniendo relaciones sexuales, a menos que el mdico le indique lo contrario.  Concurra a los controles mdicos.  SOLICITE AYUDA SI:  Siente mareos.  Sufre calambres o presin leves en la parte baja del vientre (abdomen).  Sufre un dolor persistente en el abdomen.  Tiene malestar estomacal (nuseas), vmitos, o tiene deposiciones acuosas (diarrea).  Advierte un olor ftido que proviene de la vagina.  Siente dolor al orinar.  SOLICITE AYUDA DE INMEDIATO SI:  Tiene fiebre.  Tiene una prdida de lquido por la vagina.  Tiene sangrado o pequeas prdidas vaginales.  Siente dolor intenso o clicos en el abdomen.  Sube o baja de peso rpidamente.  Tiene dificultades para recuperar el aliento y siente dolor en el pecho.  Sbitamente se   le hinchan mucho el rostro, las manos, los tobillos, los pies o las piernas.  No ha sentido los movimientos del beb durante una hora.  Siente un dolor de cabeza intenso que no se alivia con medicamentos.  Su visin se modifica.  Esta informacin no tiene como fin reemplazar el consejo del mdico. Asegrese de hacerle al mdico cualquier pregunta que  tenga. Document Released: 09/15/2012 Document Revised: 02/03/2014 Document Reviewed: 03/16/2012 Elsevier Interactive Patient Education  2017 Elsevier Inc.  

## 2016-03-04 NOTE — Progress Notes (Signed)
Pt c/o back and abd pain while lying down, trouble sleeping

## 2016-03-05 ENCOUNTER — Encounter: Payer: Medicaid Other | Admitting: Medical

## 2016-03-17 ENCOUNTER — Ambulatory Visit (HOSPITAL_COMMUNITY)
Admission: RE | Admit: 2016-03-17 | Discharge: 2016-03-17 | Disposition: A | Discharge status: Home / Self Care | Payer: Medicaid Other | Source: Ambulatory Visit | Attending: Medical | Admitting: Medical

## 2016-03-17 ENCOUNTER — Other Ambulatory Visit: Payer: Self-pay | Admitting: Medical

## 2016-03-17 DIAGNOSIS — Z0489 Encounter for examination and observation for other specified reasons: Secondary | ICD-10-CM

## 2016-03-17 DIAGNOSIS — Z362 Encounter for other antenatal screening follow-up: Secondary | ICD-10-CM | POA: Diagnosis not present

## 2016-03-17 DIAGNOSIS — Z3482 Encounter for supervision of other normal pregnancy, second trimester: Secondary | ICD-10-CM

## 2016-03-17 DIAGNOSIS — Z3A27 27 weeks gestation of pregnancy: Secondary | ICD-10-CM | POA: Diagnosis not present

## 2016-03-17 DIAGNOSIS — IMO0002 Reserved for concepts with insufficient information to code with codable children: Secondary | ICD-10-CM

## 2016-03-25 ENCOUNTER — Ambulatory Visit (INDEPENDENT_AMBULATORY_CARE_PROVIDER_SITE_OTHER): Payer: Medicaid Other | Admitting: Student

## 2016-03-25 VITALS — BP 111/59 | HR 79 | Wt 112.4 lb

## 2016-03-25 DIAGNOSIS — Z3402 Encounter for supervision of normal first pregnancy, second trimester: Secondary | ICD-10-CM

## 2016-03-25 DIAGNOSIS — R12 Heartburn: Secondary | ICD-10-CM

## 2016-03-25 DIAGNOSIS — R102 Pelvic and perineal pain: Secondary | ICD-10-CM

## 2016-03-25 DIAGNOSIS — Z3403 Encounter for supervision of normal first pregnancy, third trimester: Secondary | ICD-10-CM

## 2016-03-25 DIAGNOSIS — O26893 Other specified pregnancy related conditions, third trimester: Secondary | ICD-10-CM

## 2016-03-25 DIAGNOSIS — O26899 Other specified pregnancy related conditions, unspecified trimester: Secondary | ICD-10-CM

## 2016-03-25 MED ORDER — RANITIDINE HCL 150 MG PO TABS: 150.0000 mg | ORAL_TABLET | Freq: Two times a day (BID) | ORAL | 0 refills | Status: DC

## 2016-03-25 NOTE — Progress Notes (Signed)
   PRENATAL VISIT NOTE  Subjective:  Gloria Nunez is a 21 y.o. [phone] at 4044w2d being seen today for ongoing prenatal care.  She is currently monitored for the following issues for this high-risk pregnancy and has Language barrier; Choroid plexus cyst of fetus; Supervision of normal pregnancy; and Pain of round ligament affecting pregnancy, antepartum on her problem list.  Patient reports backache, heartburn and abdominal pain. Reports sharp lower abdominal pain that is worse with walking; also reports vaginal pressure. Contractions: Irregular. Vag. Bleeding: None.  Movement: Present. Denies leaking of fluid.   The following portions of the patient's history were reviewed and updated as appropriate: allergies, current medications, past family history, past medical history, past social history, past surgical history and problem list. Problem list updated.  Objective:   Vitals:   03/25/16 1429  BP: (!) 111/59  Pulse: 79  Weight: 112 lb 6.4 oz (51 kg)    Fetal Status: Fetal Heart Rate (bpm): 154 Fundal Height: 28 cm Movement: Present     General:  Alert, oriented and cooperative. Patient is in no acute distress.  Skin: Skin is warm and dry. No rash noted.   Cardiovascular: Normal heart rate noted  Respiratory: Normal respiratory effort, no problems with respiration noted  Abdomen: Soft, gravid, appropriate for gestational age. Pain/Pressure: Present     Pelvic:  Cervical exam performed Dilation: Closed Effacement (%): Thick Station: -3  Extremities: Normal range of motion.     Mental Status: Normal mood and affect. Normal behavior. Normal judgment and thought content.   Assessment and Plan:  Pregnancy: [id number] at 444w2d  1. Encounter for supervision of normal first pregnancy in second trimester -Pt is not fasting & declines 28 wk labs today; will schedule labs visit for later this week  2. Heartburn during pregnancy in third trimester  - ranitidine (ZANTAC) 150 MG tablet;  Take 1 tablet (150 mg total) by mouth 2 (two) times daily.  Dispense: 60 tablet; Refill: 0  3. Pain of round ligament affecting pregnancy, antepartum -continue wearing maternity support belt -cervix closed  Preterm labor symptoms and general obstetric precautions including but not limited to vaginal bleeding, contractions, leaking of fluid and fetal movement were reviewed in detail with the patient. Please refer to After Visit Summary for other counseling recommendations.  Return in about 2 weeks (around 04/08/2016) for Routine OB.   Judeth HornErin Armend Hochstatter, NP

## 2016-03-25 NOTE — Patient Instructions (Addendum)
Third Trimester of Pregnancy The third trimester is from week 29 through week 42, months 7 through 9. This trimester is when your unborn baby (fetus) is growing very fast. At the end of the ninth month, the unborn baby is about 20 inches in length. It weighs about 6-10 pounds. Follow these instructions at home:  Avoid all smoking, herbs, and alcohol. Avoid drugs not approved by your doctor.  Do not use any tobacco products, including cigarettes, chewing tobacco, and electronic cigarettes. If you need help quitting, ask your doctor. You may get counseling or other support to help you quit.  Only take medicine as told by your doctor. Some medicines are safe and some are not during pregnancy.  Exercise only as told by your doctor. Stop exercising if you start having cramps.  Eat regular, healthy meals.  Wear a good support bra if your breasts are tender.  Do not use hot tubs, steam rooms, or saunas.  Wear your seat belt when driving.  Avoid raw meat, uncooked cheese, and liter boxes and soil used by cats.  Take your prenatal vitamins.  Take 1500-2000 milligrams of calcium daily starting at the 20th week of pregnancy until you deliver your baby.  Try taking medicine that helps you poop (stool softener) as needed, and if your doctor approves. Eat more fiber by eating fresh fruit, vegetables, and whole grains. Drink enough fluids to keep your pee (urine) clear or pale yellow.  Take warm water baths (sitz baths) to soothe pain or discomfort caused by hemorrhoids. Use hemorrhoid cream if your doctor approves.  If you have puffy, bulging veins (varicose veins), wear support hose. Raise (elevate) your feet for 15 minutes, 3-4 times a day. Limit salt in your diet.  Avoid heavy lifting, wear low heels, and sit up straight.  Rest with your legs raised if you have leg cramps or low back pain.  Visit your dentist if you have not gone during your pregnancy. Use a soft toothbrush to brush your  teeth. Be gentle when you floss.  You can have sex (intercourse) unless your doctor tells you not to.  Do not travel far distances unless you must. Only do so with your doctor's approval.  Take prenatal classes.  Practice driving to the hospital.  Pack your hospital bag.  Prepare the baby's room.  Go to your doctor visits. Get help if:  You are not sure if you are in labor or if your water has broken.  You are dizzy.  You have mild cramps or pressure in your lower belly (abdominal).  You have a nagging pain in your belly area.  You continue to feel sick to your stomach (nauseous), throw up (vomit), or have watery poop (diarrhea).  You have bad smelling fluid coming from your vagina.  You have pain with peeing (urination). Get help right away if:  You have a fever.  You are leaking fluid from your vagina.  You are spotting or bleeding from your vagina.  You have severe belly cramping or pain.  You lose or gain weight rapidly.  You have trouble catching your breath and have chest pain.  You notice sudden or extreme puffiness (swelling) of your face, hands, ankles, feet, or legs.  You have not felt the baby move in over an hour.  You have severe headaches that do not go away with medicine.  You have vision changes. This information is not intended to replace advice given to you by your health care provider. Make   sure you discuss any questions you have with your health care provider. Document Released: 04/09/2009 Document Revised: 06/21/2015 Document Reviewed: 03/16/2012 Elsevier Interactive Patient Education  2017 Elsevier In Heartburn During Pregnancy Heartburn is pain or discomfort in the throat or chest. It may cause a burning feeling. It happens when stomach acid moves up into the tube that carries food from your mouth to your stomach (esophagus). Heartburn is common during pregnancy. It usually goes away or gets better after giving birth. Follow these  instructions at home: Eating and drinking   Do not drink alcohol while you are pregnant.  Figure out which foods and beverages make you feel worse, and avoid them.  Beverages that you may want to avoid include:  Coffee and tea (with or without caffeine).  Energy drinks and sports drinks.  Bubbly (carbonated) drinks or sodas.  Citrus fruit juices.  Foods that you may want to avoid include:  Chocolate and cocoa.  Peppermint and mint flavorings.  Garlic, onions, and horseradish.  Spicy and acidic foods. These include peppers, chili powder, curry powder, vinegar, hot sauces, and barbecue sauce.  Citrus fruits, such as oranges, lemons, and limes.  Tomato-based foods, such as red sauce, chili, and salsa.  Fried and fatty foods, such as donuts, french fries, potato chips, and high-fat dressings.  High-fat meats, such as hot dogs, cold cuts, sausage, ham, and bacon.  High-fat dairy items, such as whole milk, butter, and cheese.  Eat small meals often, instead of large meals.  Avoid drinking a lot of liquid with your meals.  Avoid eating meals during the 2-3 hours before you go to bed.  Avoid lying down right after you eat.  Do not exercise right after you eat. Medicines   Take over-the-counter and prescription medicines only as told by your doctor.  Do not take aspirin, ibuprofen, or other NSAIDs unless your doctor tells you to do that.  Your doctor may tell you to avoid medicines that have sodium bicarbonate in them. General instructions   If told, raise the head of your bed about 6 inches (15 cm). You can do this by putting blocks under the legs. Sleeping with more pillows does not help with heartburn.  Do not use any products that contain nicotine or tobacco, such as cigarettes and e-cigarettes. If you need help quitting, ask your doctor.  Wear loose-fitting clothing.  Try to lower your stress, such as with yoga or meditation. If you need help, ask your  doctor.  Stay at a healthy weight. If you are overweight, work with your doctor to safely lose weight.  Keep all follow-up visits as told by your doctor. This is important. Contact a doctor if:  You get new symptoms.  Your symptoms do not get better with treatment.  You have weight loss and you do not know why.  You have trouble swallowing.  You make loud sounds when you breathe (wheeze).  You have a cough that does not go away.  You have heartburn often for more than 2 weeks.  You feel sick to your stomach (nauseous), and this does not get better with treatment.  You are throwing up (vomiting), and this does not get better with treatment.  You have pain in your belly (abdomen). Get help right away if:  You have very bad chest pain that spreads to your arm, neck, or jaw.  You feel sweaty, dizzy, or light-headed.  You have trouble breathing.  You have pain when swallowing.  You throw up  and your throw-up looks like blood or coffee grounds.  Your poop (stool) is bloody or black. This information is not intended to replace advice given to you by your health care provider. Make sure you discuss any questions you have with your health care provider. Document Released: 02/15/2010 Document Revised: 10/01/2015 Document Reviewed: 10/01/2015 Elsevier Interactive Patient Education  2017 [employer].

## 2016-03-25 NOTE — Progress Notes (Signed)
Declined tdap

## 2016-03-28 ENCOUNTER — Other Ambulatory Visit: Payer: Medicaid Other

## 2016-03-28 DIAGNOSIS — Z3493 Encounter for supervision of normal pregnancy, unspecified, third trimester: Secondary | ICD-10-CM

## 2016-03-29 LAB — CBC
HEMOGLOBIN: 9.6 g/dL — AB (ref 11.1–15.9)
Hematocrit: 28.9 % — ABNORMAL LOW (ref 34.0–46.6)
MCH: 32 pg (ref 26.6–33.0)
MCHC: 33.2 g/dL (ref 31.5–35.7)
MCV: 96 fL (ref 79–97)
PLATELETS: 239 10*3/uL (ref 150–379)
RBC: 3 x10E6/uL — ABNORMAL LOW (ref 3.77–5.28)
RDW: 13.1 % (ref 12.3–15.4)
WBC: 7.2 10*3/uL (ref 3.4–10.8)

## 2016-03-29 LAB — HIV ANTIBODY (ROUTINE TESTING W REFLEX): HIV SCREEN 4TH GENERATION: NONREACTIVE

## 2016-03-29 LAB — RPR: RPR: NONREACTIVE

## 2016-03-29 LAB — GLUCOSE TOLERANCE, 2 HOURS W/ 1HR
GLUCOSE, 1 HOUR: 97 mg/dL (ref 65–179)
GLUCOSE, 2 HOUR: 87 mg/dL (ref 65–152)
GLUCOSE, FASTING: 78 mg/dL (ref 65–91)

## 2016-04-07 ENCOUNTER — Encounter (HOSPITAL_COMMUNITY): Payer: Self-pay | Admitting: *Deleted

## 2016-04-07 ENCOUNTER — Inpatient Hospital Stay (HOSPITAL_COMMUNITY)
Admission: AD | Admit: 2016-04-07 | Discharge: 2016-04-07 | Disposition: A | Discharge status: Home / Self Care | Payer: Medicaid Other | Source: Ambulatory Visit | Attending: Obstetrics and Gynecology | Admitting: Obstetrics and Gynecology

## 2016-04-07 DIAGNOSIS — R102 Pelvic and perineal pain: Secondary | ICD-10-CM | POA: Insufficient documentation

## 2016-04-07 DIAGNOSIS — Z3A30 30 weeks gestation of pregnancy: Secondary | ICD-10-CM | POA: Insufficient documentation

## 2016-04-07 DIAGNOSIS — O4703 False labor before 37 completed weeks of gestation, third trimester: Secondary | ICD-10-CM | POA: Diagnosis not present

## 2016-04-07 DIAGNOSIS — O479 False labor, unspecified: Secondary | ICD-10-CM | POA: Diagnosis not present

## 2016-04-07 DIAGNOSIS — O4700 False labor before 37 completed weeks of gestation, unspecified trimester: Secondary | ICD-10-CM

## 2016-04-07 DIAGNOSIS — N949 Unspecified condition associated with female genital organs and menstrual cycle: Secondary | ICD-10-CM

## 2016-04-07 LAB — URINALYSIS, ROUTINE W REFLEX MICROSCOPIC
Bacteria, UA: NONE SEEN
Bilirubin Urine: NEGATIVE
GLUCOSE, UA: NEGATIVE mg/dL
Hgb urine dipstick: NEGATIVE
Ketones, ur: NEGATIVE mg/dL
Nitrite: NEGATIVE
PH: 7 (ref 5.0–8.0)
Protein, ur: NEGATIVE mg/dL
SPECIFIC GRAVITY, URINE: 1.019 (ref 1.005–1.030)

## 2016-04-07 LAB — FETAL FIBRONECTIN: Fetal Fibronectin: NEGATIVE

## 2016-04-07 MED ORDER — NIFEDIPINE 10 MG PO CAPS
10.0000 mg | ORAL_CAPSULE | Freq: Once | ORAL | Status: AC
  Administered 2016-04-07: 10 mg via ORAL
  Filled 2016-04-07: qty 1

## 2016-04-07 MED ORDER — CYCLOBENZAPRINE HCL 10 MG PO TABS: 10.0000 mg | ORAL_TABLET | Freq: Two times a day (BID) | ORAL | 0 refills | Status: DC | PRN

## 2016-04-07 MED ORDER — CYCLOBENZAPRINE HCL 10 MG PO TABS
10.0000 mg | ORAL_TABLET | Freq: Once | ORAL | Status: AC
  Administered 2016-04-07: 10 mg via ORAL
  Filled 2016-04-07: qty 1

## 2016-04-07 NOTE — Discharge Instructions (Signed)
Contracciones de Designer, multimedia (Braxton Hicks Contractions) Durante el [place], pueden presentarse contracciones uterinas que no siempre indican que est en [place]. QU SON LAS CONTRACCIONES DE BRAXTON HICKS? [place] se presentan antes del [place] de [place] se conocen como contracciones de [place] o falso trabajo de [place]. Hacia el final del embarazo (32 a 34semanas), estas contracciones pueden aparecen con ms frecuencia y volverse ms intensas. No corresponden al Aleen Campi de parto verdadero porque estas contracciones no producen el agrandamiento (la dilatacin) y el afinamiento del cuello del tero. Algunas veces, es difcil distinguirlas del trabajo de parto verdadero porque en algunos casos pueden ser [employer], y las personas tienen diferentes niveles de tolerancia al [employer] No debe sentirse avergonzada si concurre al hospital con falso trabajo de [place]. En ocasiones, la nica forma de saber si el trabajo de parto es verdadero es que el mdico determine si hay cambios en el cuello del tero. Si no hay problemas prenatales u otras complicaciones de salud asociadas con el embarazo, no habr inconvenientes si la envan a su casa con falso trabajo de parto y espera que comience el verdadero. CMO DIFERENCIAR EL TRABAJO DE PARTO FALSO DEL VERDADERO Falso trabajo de parto  Las contracciones del falso trabajo de parto duran menos y no son tan intensas como las verdaderas.  Generalmente son irregulares.  A menudo, se sienten en la parte delantera de la parte baja del abdomen y en la ingle,  y pueden desaparecer cuando camina o cambia de posicin mientras est acostada.  Las contracciones se vuelven ms dbiles y su duracin es Adult nurse a medida que el tiempo transcurre.  Por lo general, no se hacen progresivamente ms intensas, regulares y Herbalist entre s como en el caso del [place] de parto verdadero. Theodis Blaze de parto  Las contracciones del verdadero  trabajo de parto duran de 30 a 70segundos, son muy regulares y suelen volverse ms intensas, y [place] su frecuencia.  No desaparecen cuando camina.  La molestia generalmente se siente en la parte superior del tero y se extiende hacia la zona inferior del abdomen y [employer] cintura.  El mdico podr examinarla para determinar si el trabajo de parto es verdadero. El examen mostrar si el cuello del tero se est dilatando y [place]. LO QUE DEBE RECORDAR  [place] haciendo los ejercicios habituales y siga otras indicaciones que el mdico le d.  Tome todos los medicamentos como le indic el mdico.  Oceanographer a las visitas prenatales regulares.  Coma y beba con moderacin si cree que est en trabajo de parto.  Si las contracciones de [employer] provocan incomodidad:  Cambie de posicin: si est acostada o descansando, camine; si est caminando, descanse.  Sintese y descanse en una baera con agua tibia.  Beba 2 o 3vasos de [place]. La deshidratacin puede provocar contracciones.  Respire lenta y profundamente varias veces por hora. CUNDO DEBO BUSCAR ASISTENCIA MDICA INMEDIATA? Solicite atencin mdica de inmediato si:  Las contracciones se intensifican, se hacen ms regulares y Arboriculturist s.  Tiene una prdida de lquido por la vagina.  Tiene fiebre.  Elimina mucosidad manchada con [place].  Tiene una hemorragia vaginal abundante.  Tiene dolor abdominal permanente.  Tiene un dolor en la zona lumbar que nunca tuvo antes.  Siente que la cabeza del beb empuja hacia abajo y ejerce presin en la zona plvica.  El beb no se mueve Dentist. Esta informacin no tiene Theme park manager el consejo del  mdico. Asegrese de hacerle al mdico cualquier pregunta que tenga. Document Released: 10/23/2004 Document Revised: 05/07/2015 Document Reviewed: 10/25/2012 Elsevier Interactive Patient Education  2017 Elsevier Inc.  Fibronectina fetal (Fetal  Fibronectin) La fibronectina fetal (FnF) es una protena que el cuerpo produce durante el [place]. Esta protena normalmente se encuentra en la secrecin vaginal a comienzos del Psychiatristembarazo y antes del parto. No debe estar presente entre la semana22 y 35de embarazo. La presencia de FnF en la vagina entre la semana22 y 35de embarazo podra ser una advertencia de que el beb nacer antes de tiempo (prematuro). Los bebs prematuros, o que nacen antes de la semana37, pueden tener problemas para respirar o alimentarse. Un estudio de FnF con resultado negativo entre la semana22 y 35de embarazo significa que no es probable que tenga un parto prematuro en las 2semanas siguientes. Pueden hacerle este estudio si tiene sntomas de trabajo de [place]parto prematuro. Estos incluyen los siguientes:  Contracciones.  Aumento de la secrecin vaginal.  Dolor de espalda. Si hay alguna probabilidad de que tenga un Aleen Campitrabajo de parto y un parto prematuro, el mdico la monitorear de cerca y, si es necesario, tomar medidas para que el nacimiento no se adelante. Este estudio requiere una muestra de la secrecin de la parte interna de la vagina. El mdico obtendr esta muestra con un hisopo de algodn. PREPARACIN PARA EL ESTUDIO  Pregntele al mdico si:  Debe evitar usar lubricantes o hacerse un lavado vaginal antes del examen.  Debe evitar tener relaciones sexuales 24horas antes del examen.  Dgale al mdico si tiene una infeccin llamada candidosis vaginal o algn sntoma de dicha infeccin:  Picazn.  Dolor.  Secrecin. RESULTADOS Es su responsabilidad retirar el resultado del Brunersburgestudio. Consulte en el laboratorio o en el departamento en el que fue realizado el estudio cundo y cmo podr Starbucks Corporationobtener los resultados. Comunquese con el mdico si tiene Smith Internationalpreguntas sobre los resultados. Los resultados de este estudio sern positivos o Audiological scientistnegativos. Significado de los Lear Corporationresultados negativos del estudio El resultado  negativo indica que no se detect FnF en la secrecin vaginal y, por lo Rentztanto, hay muy pocas probabilidades de que entre en Cordovatrabajo de parto en las prximas [place] semanas. Si todava tiene sntomas de Fijitrabajo de [address]parto prematuro, pueden hacerle este estudio de Hess Corporationnuevo en dos semanas. Significado de Starbucks Corporationlos resultados positivos del estudio El resultado positivo indica que se detect FnF en la secrecin vaginal. El resultado positivo no significa que entrar en trabajo de parto prematuro, sino que su riesgo es ms alto. El mdico puede hacerle otros estudios y exmenes para supervisar de cerca su Psychiatristembarazo. Esta informacin no tiene Theme park managercomo fin reemplazar el consejo del mdico. Asegrese de hacerle al mdico cualquier pregunta que tenga. Document Released: 10/23/2007 Document Revised: 02/03/2014 Document Reviewed: 04/12/2013 Elsevier Interactive Patient Education  2017 Arvin[employer]itorElsevier Inc.

## 2016-04-07 NOTE — MAU Provider Note (Signed)
History     CSN: [id number]  Arrival date and time: 04/07/16 4098   First Provider Initiated Contact with Patient 04/07/16 1017      Chief Complaint  Patient presents with  . vaginal pressure  . Contractions   HPI   Ms.Gloria Nunez is a 21 y.o. female [phone]  @ [redacted]w[redacted]d here in MAU with vaginal pressure. The pressure started last night around 8pm. The pain kept her awake last night. She did not taken anything for the pain. She denies bleeding. The pain worsens when she walks.  No recent intercourse. The pain is located in her lower abdomen, in the center.   + fetal movement since arrival. Denies vaginal bleeding.   OB History    Gravida Para Term Preterm AB Living   4 2 2   1 2    SAB TAB Ectopic Multiple Live Births   1     0 2      Past Medical History:  Diagnosis Date  . Medical history non-contributory   . Pregnant     Past Surgical History:  Procedure Laterality Date  . NO PAST SURGERIES      Family History  Problem Relation Age of Onset  . Diabetes Maternal Grandfather   . Heart disease Maternal Grandfather     Social History  Substance Use Topics  . Smoking status: Never Smoker  . Smokeless tobacco: Never Used  . Alcohol use No    Allergies:  Allergies  Allergen Reactions  . Other Nausea And Vomiting and Other (See Comments)    Pt states that she is allergic to mayonnaise.      Prescriptions Prior to Admission  Medication Sig Dispense Refill Last Dose  . Prenatal Vit-Fe Fumarate-FA (PRENATAL MULTIVITAMIN) TABS tablet Take 1 tablet by mouth daily.   04/06/2016 at Unknown time  . ranitidine (ZANTAC) 150 MG tablet Take 1 tablet (150 mg total) by mouth 2 (two) times daily. 60 tablet 0 04/06/2016 at Unknown time  . promethazine (PHENERGAN) 25 MG tablet Take 0.5-1 tablets (12.5-25 mg total) by mouth every 6 (six) hours as needed. (Patient not taking: Reported on 04/07/2016) 30 tablet 2 Not Taking at Unknown time   Results for orders placed or  performed during the hospital encounter of 04/07/16 (from the past 48 hour(s))  Urinalysis, Routine w reflex microscopic     Status: Abnormal   Collection Time: 04/07/16  9:25 AM  Result Value Ref Range   Color, Urine YELLOW YELLOW   APPearance HAZY (A) CLEAR   Specific Gravity, Urine 1.019 1.005 - 1.030   pH 7.0 5.0 - 8.0   Glucose, UA NEGATIVE NEGATIVE mg/dL   Hgb urine dipstick NEGATIVE NEGATIVE   Bilirubin Urine NEGATIVE NEGATIVE   Ketones, ur NEGATIVE NEGATIVE mg/dL   Protein, ur NEGATIVE NEGATIVE mg/dL   Nitrite NEGATIVE NEGATIVE   Leukocytes, UA SMALL (A) NEGATIVE   RBC / HPF 0-5 0 - 5 RBC/hpf   WBC, UA 0-5 0 - 5 WBC/hpf   Bacteria, UA NONE SEEN NONE SEEN   Squamous Epithelial / LPF 6-30 (A) NONE SEEN   Mucous PRESENT   Fetal fibronectin     Status: None   Collection Time: 04/07/16 10:20 AM  Result Value Ref Range   Fetal Fibronectin NEGATIVE NEGATIVE    Review of Systems  Genitourinary: Positive for pelvic pain. Negative for vaginal bleeding.   Physical Exam   Blood pressure (!) 96/54, pulse 74, temperature 98.2 F (36.8 C), temperature source Oral, resp.  rate 18, height 5\' 5"  (1.651 m), weight 114 lb 8 oz (51.9 kg), last menstrual period 09/03/2015, SpO2 99 %, unknown if currently breastfeeding.  Physical Exam  Constitutional: She is oriented to person, place, and time. She appears well-developed and well-nourished. No distress.  HENT:  Head: Normocephalic.  GI: Soft. Normal appearance. There is generalized tenderness. There is no rigidity, no rebound and no guarding.  Genitourinary:  Genitourinary Comments: Dilation: Closed Exam by:: J. Antara Brecheisen,NP  Musculoskeletal: Normal range of motion.  Neurological: She is alert and oriented to person, place, and time.  Skin: Skin is warm. She is not diaphoretic.  Psychiatric: Her behavior is normal.   Fetal Tracing: Baseline: 135 bpm Variability: moderate  Accelerations: 15x15 Decelerations: none Toco: occasional  contraction with UI   MAU Course  Procedures  None  MDM  Procardia 10 mg PO X 2  Flexeril 10 mg PO  Patient feeling much better. Pain down from 6/10 from 10/10 Patient appears comfortable using her cell phone and conversing approprietly Fetal fibronectin negative.   Assessment and Plan   A:  1. Braxton Hicks contractions   2. Threatened premature labor, antepartum   3. Round ligament pain     P:  Discharge home in stable condition  Return to MAU if symptoms worsen Preterm labor precautions  Follow up with OB as scheduled Rx: Flexeril    Duane LopeJennifer I Graeson [place]ouri, NP 04/07/2016 12:29 PM

## 2016-04-07 NOTE — MAU Note (Signed)
Pt c/o starting to feel her uterus get tight since last night. Pt states she feels a lot of vaginal pressure and is having difficulty walking. Pt states baby has only moved once this morning.Pt denies bleeding and leaking of fluid.Pt denies problems with pregnancy so far.

## 2016-04-13 ENCOUNTER — Inpatient Hospital Stay (HOSPITAL_COMMUNITY)
Admission: AD | Admit: 2016-04-13 | Discharge: 2016-04-13 | Disposition: A | Discharge status: Home / Self Care | Payer: Medicaid Other | Source: Ambulatory Visit | Attending: Family Medicine | Admitting: Family Medicine

## 2016-04-13 ENCOUNTER — Encounter (HOSPITAL_COMMUNITY): Payer: Self-pay | Admitting: *Deleted

## 2016-04-13 DIAGNOSIS — O219 Vomiting of pregnancy, unspecified: Secondary | ICD-10-CM | POA: Diagnosis not present

## 2016-04-13 DIAGNOSIS — O26893 Other specified pregnancy related conditions, third trimester: Secondary | ICD-10-CM | POA: Diagnosis not present

## 2016-04-13 DIAGNOSIS — R112 Nausea with vomiting, unspecified: Secondary | ICD-10-CM | POA: Insufficient documentation

## 2016-04-13 DIAGNOSIS — Z3A31 31 weeks gestation of pregnancy: Secondary | ICD-10-CM | POA: Insufficient documentation

## 2016-04-13 DIAGNOSIS — O4703 False labor before 37 completed weeks of gestation, third trimester: Secondary | ICD-10-CM | POA: Diagnosis not present

## 2016-04-13 DIAGNOSIS — Z79899 Other long term (current) drug therapy: Secondary | ICD-10-CM | POA: Insufficient documentation

## 2016-04-13 LAB — URINALYSIS, ROUTINE W REFLEX MICROSCOPIC
Bilirubin Urine: NEGATIVE
Glucose, UA: NEGATIVE mg/dL
Hgb urine dipstick: NEGATIVE
KETONES UR: 5 mg/dL — AB
Nitrite: NEGATIVE
Protein, ur: 30 mg/dL — AB
Specific Gravity, Urine: 1.027 (ref 1.005–1.030)
pH: 6 (ref 5.0–8.0)

## 2016-04-13 LAB — CBC WITH DIFFERENTIAL/PLATELET
BASOS ABS: 0 10*3/uL (ref 0.0–0.1)
Band Neutrophils: 69 %
Basophils Relative: 1 %
Blasts: 0 %
EOS PCT: 0 %
Eosinophils Absolute: 0 10*3/uL (ref 0.0–0.7)
HCT: 26.3 % — ABNORMAL LOW (ref 36.0–46.0)
Hemoglobin: 9 g/dL — ABNORMAL LOW (ref 12.0–15.0)
LYMPHS ABS: 0.3 10*3/uL — AB (ref 0.7–4.0)
LYMPHS PCT: 8 %
MCH: 31.8 pg (ref 26.0–34.0)
MCHC: 34.2 g/dL (ref 30.0–36.0)
MCV: 92.9 fL (ref 78.0–100.0)
METAMYELOCYTES PCT: 0 %
Monocytes Absolute: 0 10*3/uL — ABNORMAL LOW (ref 0.1–1.0)
Monocytes Relative: 1 %
Myelocytes: 0 %
NEUTROS ABS: 3.1 10*3/uL (ref 1.7–7.7)
Neutrophils Relative %: 21 %
OTHER: 0 %
Platelets: 217 10*3/uL (ref 150–400)
Promyelocytes Absolute: 0 %
RBC: 2.83 MIL/uL — AB (ref 3.87–5.11)
RDW: 12.9 % (ref 11.5–15.5)
WBC Morphology: INCREASED
WBC: 3.4 10*3/uL — AB (ref 4.0–10.5)
nRBC: 0 /100 WBC

## 2016-04-13 LAB — COMPREHENSIVE METABOLIC PANEL
ALBUMIN: 3 g/dL — AB (ref 3.5–5.0)
ALT: 11 U/L — ABNORMAL LOW (ref 14–54)
AST: 16 U/L (ref 15–41)
Alkaline Phosphatase: 54 U/L (ref 38–126)
Anion gap: 8 (ref 5–15)
BUN: 14 mg/dL (ref 6–20)
CHLORIDE: 106 mmol/L (ref 101–111)
CO2: 20 mmol/L — ABNORMAL LOW (ref 22–32)
Calcium: 7.3 mg/dL — ABNORMAL LOW (ref 8.9–10.3)
Creatinine, Ser: <0.3 mg/dL — ABNORMAL LOW (ref 0.44–1.00)
GLUCOSE: 94 mg/dL (ref 65–99)
POTASSIUM: 3.1 mmol/L — AB (ref 3.5–5.1)
Sodium: 134 mmol/L — ABNORMAL LOW (ref 135–145)
Total Bilirubin: 0.8 mg/dL (ref 0.3–1.2)
Total Protein: 6.3 g/dL — ABNORMAL LOW (ref 6.5–8.1)

## 2016-04-13 MED ORDER — FAMOTIDINE IN NACL 20-0.9 MG/50ML-% IV SOLN
20.0000 mg | Freq: Once | INTRAVENOUS | Status: AC
  Administered 2016-04-13: 20 mg via INTRAVENOUS
  Filled 2016-04-13: qty 50

## 2016-04-13 MED ORDER — BUTORPHANOL TARTRATE 1 MG/ML IJ SOLN
1.0000 mg | Freq: Once | INTRAMUSCULAR | Status: AC
  Administered 2016-04-13: 1 mg via INTRAVENOUS
  Filled 2016-04-13: qty 1

## 2016-04-13 MED ORDER — PROMETHAZINE HCL 25 MG PO TABS: 25.0000 mg | ORAL_TABLET | Freq: Four times a day (QID) | ORAL | 0 refills | Status: DC | PRN

## 2016-04-13 MED ORDER — NIFEDIPINE 10 MG PO CAPS
10.0000 mg | ORAL_CAPSULE | ORAL | Status: DC | PRN
  Administered 2016-04-13: 10 mg via ORAL
  Filled 2016-04-13 (×3): qty 1

## 2016-04-13 MED ORDER — LACTATED RINGERS IV BOLUS (SEPSIS)
1000.0000 mL | Freq: Once | INTRAVENOUS | Status: AC
  Administered 2016-04-13: 1000 mL via INTRAVENOUS

## 2016-04-13 MED ORDER — SODIUM CHLORIDE 0.9 % IV SOLN
25.0000 mg | Freq: Once | INTRAVENOUS | Status: AC
  Administered 2016-04-13: 25 mg via INTRAVENOUS
  Filled 2016-04-13: qty 1

## 2016-04-13 NOTE — MAU Provider Note (Signed)
History     CSN: [id number]  Arrival date and time: 04/13/16 1306    First Provider Initiated Contact with Patient 04/13/16 1349      Chief Complaint  Patient presents with  . Emesis  . contracting   HPI Gloria Nunez is a 21 y.o. [phone] at 4862w0d who presents with n/v, back pain, & contractions. Symptoms started with vomiting last night. Reports vomiting 11 times today. Has not taken antiemetic. Denies sick contacts.  Also reports constant back pain in right lower back & intermittent RLQ pain associated with contractions. Rates pain 10/10. Took flexeril last night without relief. Can't tell how frequent pain is occurring. Denies fever/chills, vaginal bleeding, LOF, dysuria, hematuria, or diarrhea. Last BM last night; was harder than normal. Positive fetal movement. Denies intercourse in the last week.   OB History    Gravida Para Term Preterm AB Living   4 2 2   1 2    SAB TAB Ectopic Multiple Live Births   1     0 2      Past Medical History:  Diagnosis Date  . Medical history non-contributory     Past Surgical History:  Procedure Laterality Date  . NO PAST SURGERIES      Family History  Problem Relation Age of Onset  . Diabetes Maternal Grandfather   . Heart disease Maternal Grandfather     Social History  Substance Use Topics  . Smoking status: Never Smoker  . Smokeless tobacco: Never Used  . Alcohol use No    Allergies:  Allergies  Allergen Reactions  . Other Nausea And Vomiting and Other (See Comments)    Pt states that she is allergic to mayonnaise.      Prescriptions Prior to Admission  Medication Sig Dispense Refill Last Dose  . cyclobenzaprine (FLEXERIL) 10 MG tablet Take 1 tablet (10 mg total) by mouth 2 (two) times daily as needed for muscle spasms. 20 tablet 0 04/13/2016 at Unknown time  . Prenatal Vit-Fe Fumarate-FA (PRENATAL MULTIVITAMIN) TABS tablet Take 1 tablet by mouth daily.   04/13/2016 at Unknown time  . ranitidine (ZANTAC) 150 MG  tablet Take 1 tablet (150 mg total) by mouth 2 (two) times daily. 60 tablet 0 04/13/2016 at Unknown time  . promethazine (PHENERGAN) 25 MG tablet Take 0.5-1 tablets (12.5-25 mg total) by mouth every 6 (six) hours as needed. (Patient not taking: Reported on 04/07/2016) 30 tablet 2 Not Taking at Unknown time    Review of Systems  Constitutional: Negative.   Gastrointestinal: Positive for abdominal pain, constipation, nausea and vomiting. Negative for diarrhea.  Genitourinary: Negative for dysuria, hematuria, vaginal bleeding and vaginal discharge.  Musculoskeletal: Positive for back pain.   Physical Exam   Blood pressure (!) 103/57, pulse 93, temperature 98.8 F (37.1 C), temperature source Oral, resp. rate 16, weight 114 lb 4 oz (51.8 kg), last menstrual period 09/03/2015, SpO2 100 %, unknown if currently breastfeeding.  Physical Exam  Nursing note and vitals reviewed. Constitutional: She is oriented to person, place, and time. She appears well-developed and well-nourished. No distress.  HENT:  Head: Normocephalic and atraumatic.  Eyes: Conjunctivae are normal. Right eye exhibits no discharge. Left eye exhibits no discharge. No scleral icterus.  Neck: Normal range of motion.  Cardiovascular: Normal rate, regular rhythm and normal heart sounds.   No murmur heard. Respiratory: Effort normal and breath sounds normal. No respiratory distress. She has no wheezes.  GI: Soft. Bowel sounds are normal. There is tenderness in the  right lower quadrant and epigastric area. There is no CVA tenderness.  Neurological: She is alert and oriented to person, place, and time.  Skin: Skin is warm and dry. She is not diaphoretic.  Psychiatric: She has a normal mood and affect. Her behavior is normal. Judgment and thought content normal.   Dilation: Closed Effacement (%): Thick Cervical Position: Posterior Exam by:: Nela Bascom,NP  Fetal Tracing:  Baseline: 145 Variability: moderate Accelerations:  15x15 Decelerations: variable x 1  Toco: every 2-5 mins > to UI after IVF & procardia MAU Course  Procedures Results for orders placed or performed during the hospital encounter of 04/13/16 (from the past 24 hour(s))  Urinalysis, Routine w reflex microscopic     Status: Abnormal   Collection Time: 04/13/16  1:20 PM  Result Value Ref Range   Color, Urine YELLOW YELLOW   APPearance HAZY (A) CLEAR   Specific Gravity, Urine 1.027 1.005 - 1.030   pH 6.0 5.0 - 8.0   Glucose, UA NEGATIVE NEGATIVE mg/dL   Hgb urine dipstick NEGATIVE NEGATIVE   Bilirubin Urine NEGATIVE NEGATIVE   Ketones, ur 5 (A) NEGATIVE mg/dL   Protein, ur 30 (A) NEGATIVE mg/dL   Nitrite NEGATIVE NEGATIVE   Leukocytes, UA SMALL (A) NEGATIVE   RBC / HPF 0-5 0 - 5 RBC/hpf   WBC, UA 0-5 0 - 5 WBC/hpf   Bacteria, UA RARE (A) NONE SEEN   Squamous Epithelial / LPF 6-30 (A) NONE SEEN   Mucous PRESENT     MDM Category 1 tracing U/a pending CBC, CMP, IV fluids with phenergan, pepcid Cervix closed; FFN collected but not sent d/t closed cervix & negative FFN 1 week ago.  Ctx decreased to UI & pain improved after IV fluids & 1 dose of procardia; SVE remains closed after >1 hr between exams Pt not observed vomiting in MAU Assessment and Plan  A: 1. Nausea and vomiting during pregnancy   2. Preterm uterine contractions, antepartum, third trimester    P: Discharge home Rx phenergan Advance diet as tolerated Discussed reasons to return to MAU including s/s of PTL Keep follow up appointment with OB/PCP   Judeth Horn 04/13/2016, 1:49 PM

## 2016-04-13 NOTE — Discharge Instructions (Signed)
Nausea and Vomiting, Adult Nausea is the feeling that you have an upset stomach or have to vomit. As nausea gets worse, it can lead to vomiting. Vomiting occurs when stomach contents are thrown up and out of the mouth. Vomiting can make you feel weak and cause you to become dehydrated. Dehydration can make you tired and thirsty, cause you to have a dry mouth, and decrease how often you urinate. Older adults and people with other diseases or a weak immune system are at higher risk for dehydration. It is important to treat your nausea and vomiting as told by your health care provider. Follow these instructions at home: Follow instructions from your health care provider about how to care for yourself at home. Eating and drinking  Follow these recommendations as told by your health care provider:  Take an oral rehydration solution (ORS). This is a drink that is sold at pharmacies and retail stores.  Drink clear fluids in small amounts as you are able. Clear fluids include water, ice chips, diluted fruit juice, and low-calorie sports drinks.  Eat bland, easy-to-digest foods in small amounts as you are able. These foods include bananas, applesauce, rice, lean meats, toast, and crackers.  Avoid fluids that contain a lot of sugar or caffeine, such as energy drinks, sports drinks, and soda.  Avoid alcohol.  Avoid spicy or fatty foods. General instructions   Drink enough fluid to keep your urine clear or pale yellow.  Wash your hands often. If soap and water are not available, use hand sanitizer.  Make sure that all people in your household wash their hands well and often.  Take over-the-counter and prescription medicines only as told by your health care provider.  Rest at home while you recover.  Watch your condition for any changes.  Breathe slowly and deeply when you feel nauseated.  Keep all follow-up visits as told by your health care provider. This is important. Contact a health care  provider if:  You have a fever.  You cannot keep fluids down.  Your symptoms get worse.  You have new symptoms.  Your nausea does not go away after two days.  You feel light-headed or dizzy.  You have a headache.  You have muscle cramps. Get help right away if:  You have pain in your chest, neck, arm, or jaw.  You feel extremely weak or you faint.  You have persistent vomiting.  You see blood in your vomit.  Your vomit looks like black coffee grounds.  You have bloody or black stools or stools that look like tar.  You have a severe headache, a stiff neck, or both.  You have a rash.  You have severe pain, cramping, or bloating in your abdomen.  You have trouble breathing or you are breathing very quickly.  Your heart is beating very quickly.  Your skin feels cold and clammy.  You feel confused.  You have pain when you urinate.  You have signs of dehydration, such as:  Dark urine, very little urine, or no urine.  Cracked lips.  Dry mouth.  Sunken eyes.  Sleepiness.  Weakness. These symptoms may represent a serious problem that is an emergency. Do not wait to see if the symptoms will go away. Get medical help right away. Call your local emergency services (911 in the [place] Do not drive yourself to the hospital. This information is not intended to replace advice given to you by your health care provider. Make sure you discuss any questions  you have with your health care provider. Document Released: 01/13/2005 Document Revised: 06/18/2015 Document Reviewed: 09/19/2014 Elsevier Interactive Patient Education  2017 [employer].    Preterm Labor and Birth Information The normal length of a pregnancy is 39-41 weeks. Preterm labor is when labor starts before 37 completed weeks of pregnancy. What are the risk factors for preterm labor? Preterm labor is more likely to occur in women who:  Have certain infections during pregnancy such as a bladder  infection, sexually transmitted infection, or infection inside the uterus (chorioamnionitis).  Have a shorter-than-normal cervix.  Have gone into preterm labor before.  Have had surgery on their cervix.  Are younger than age 68 or older than age 45.  Are African American.  Are pregnant with twins or multiple babies (multiple gestation).  Take street drugs or smoke while pregnant.  Do not gain enough weight while pregnant.  Became pregnant shortly after having been pregnant. What are the symptoms of preterm labor? Symptoms of preterm labor include:  Cramps similar to those that can happen during a menstrual period. The cramps may happen with diarrhea.  Pain in the abdomen or lower back.  Regular uterine contractions that may feel like tightening of the abdomen.  A feeling of increased pressure in the pelvis.  Increased watery or bloody mucus discharge from the vagina.  Water breaking (ruptured amniotic sac). Why is it important to recognize signs of preterm labor? It is important to recognize signs of preterm labor because babies who are born prematurely may not be fully developed. This can put them at an increased risk for:  Long-term (chronic) heart and lung problems.  Difficulty immediately after birth with regulating body systems, including blood sugar, body temperature, heart rate, and breathing rate.  Bleeding in the brain.  Cerebral palsy.  Learning difficulties.  Death. These risks are highest for babies who are born before 34 weeks of pregnancy. How is preterm labor treated? Treatment depends on the length of your pregnancy, your condition, and the health of your baby. It may involve:  Having a stitch (suture) placed in your cervix to prevent your cervix from opening too early (cerclage).  Taking or being given medicines, such as:  Hormone medicines. These may be given early in pregnancy to help support the pregnancy.  Medicine to stop  contractions.  Medicines to help mature the babys lungs. These may be prescribed if the risk of delivery is high.  Medicines to prevent your baby from developing cerebral palsy. If the labor happens before 34 weeks of pregnancy, you may need to stay in the hospital. What should I do if I think I am in preterm labor? If you think that you are going into preterm labor, call your health care provider right away. How can I prevent preterm labor in future pregnancies? To increase your chance of having a full-term pregnancy:  Do not use any tobacco products, such as cigarettes, chewing tobacco, and e-cigarettes. If you need help quitting, ask your health care provider.  Do not use street drugs or medicines that have not been prescribed to you during your pregnancy.  Talk with your health care provider before taking any herbal supplements, even if you have been taking them regularly.  Make sure you gain a healthy amount of weight during your pregnancy.  Watch for infection. If you think that you might have an infection, get it checked right away.  Make sure to tell your health care provider if you have gone into preterm labor  before. This information is not intended to replace advice given to you by your health care provider. Make sure you discuss any questions you have with your health care provider. Document Released: 04/05/2003 Document Revised: 06/26/2015 Document Reviewed: 06/06/2015 Elsevier Interactive Patient Education  2017 [employer]

## 2016-04-13 NOTE — MAU Note (Signed)
Been throwing up since 10 last night, can't eat or drink anything.  Having back pain.  When she throws up, she contracts.

## 2016-04-15 ENCOUNTER — Encounter: Payer: Medicaid Other | Admitting: Certified Nurse Midwife

## 2016-04-17 ENCOUNTER — Ambulatory Visit (INDEPENDENT_AMBULATORY_CARE_PROVIDER_SITE_OTHER): Payer: Medicaid Other | Admitting: Advanced Practice Midwife

## 2016-04-17 ENCOUNTER — Encounter: Payer: Self-pay | Admitting: Advanced Practice Midwife

## 2016-04-17 VITALS — BP 98/49 | HR 95 | Wt 117.2 lb

## 2016-04-17 DIAGNOSIS — Z3493 Encounter for supervision of normal pregnancy, unspecified, third trimester: Secondary | ICD-10-CM

## 2016-04-17 DIAGNOSIS — O4703 False labor before 37 completed weeks of gestation, third trimester: Secondary | ICD-10-CM

## 2016-04-17 MED ORDER — NIFEDIPINE 10 MG PO CAPS: 10.0000 mg | ORAL_CAPSULE | Freq: Four times a day (QID) | ORAL | 1 refills | Status: DC | PRN

## 2016-04-17 NOTE — Patient Instructions (Signed)

## 2016-04-17 NOTE — Progress Notes (Signed)
   PRENATAL VISIT NOTE  Subjective:  Gloria Nunez is a 21 y.o. [phone] at 3959w4d being seen today for ongoing prenatal care.  She is currently monitored for the following issues for this low-risk pregnancy and has Language barrier; Choroid plexus cyst of fetus; Supervision of normal pregnancy; Pain of round ligament affecting pregnancy, antepartum; and Preterm uterine contractions in third trimester, antepartum on her problem list.  Patient reports still some nausea, has phenergan at home.   Still having a lot of contraction.  Contractions: Irregular. Vag. Bleeding: None.  Movement: Present. Denies leaking of fluid.   The following portions of the patient's history were reviewed and updated as appropriate: allergies, current medications, past family history, past medical history, past social history, past surgical history and problem list. Problem list updated.  Objective:   Vitals:   04/17/16 1453  BP: (!) 98/49  Pulse: 95  Weight: 117 lb 3.2 oz (53.2 kg)    Fetal Status: Fetal Heart Rate (bpm): 147   Movement: Present     General:  Alert, oriented and cooperative. Patient is in no acute distress.  Skin: Skin is warm and dry. No rash noted.   Cardiovascular: Normal heart rate noted  Respiratory: Normal respiratory effort, no problems with respiration noted  Abdomen: Soft, gravid, appropriate for gestational age. Pain/Pressure: Present     Pelvic:  Cervical exam deferred        Extremities: Normal range of motion.  Edema: None  Mental Status: Normal mood and affect. Normal behavior. Normal judgment and thought content.   Assessment and Plan:  Pregnancy: [id number] at 3659w4d  1. Third trimester pregnancy   2. Preterm uterine contractions in third trimester, antepartum      Will Rx Procardia for PRN use for comfort at home      FFn neg 04/07/16  Preterm labor symptoms and general obstetric precautions including but not limited to vaginal bleeding, contractions, leaking of  fluid and fetal movement were reviewed in detail with the patient. Please refer to After Visit Summary for other counseling recommendations.  Return in about 2 weeks (around 05/01/2016) for [facility].   Aviva SignsMarie L Merry Pond, CNM

## 2016-05-01 ENCOUNTER — Encounter: Payer: Medicaid Other | Admitting: Obstetrics and Gynecology

## 2016-05-07 ENCOUNTER — Telehealth: Payer: Self-pay | Admitting: Obstetrics and Gynecology

## 2016-05-07 ENCOUNTER — Encounter: Payer: Medicaid Other | Admitting: Obstetrics and Gynecology

## 2016-05-07 NOTE — Telephone Encounter (Signed)
Called patient due to missed ob appointment today. Was only able to leave message on ph. [phone], informed patient to please call clinic to reschedule missed appointment.

## 2016-05-15 ENCOUNTER — Ambulatory Visit (INDEPENDENT_AMBULATORY_CARE_PROVIDER_SITE_OTHER): Payer: Medicaid Other | Admitting: Student

## 2016-05-15 ENCOUNTER — Other Ambulatory Visit (HOSPITAL_COMMUNITY)
Admission: RE | Admit: 2016-05-15 | Discharge: 2016-05-15 | Disposition: A | Discharge status: Home / Self Care | Payer: Medicaid Other | Source: Ambulatory Visit | Attending: Student | Admitting: Student

## 2016-05-15 VITALS — BP 105/54 | HR 79 | Wt 119.9 lb

## 2016-05-15 DIAGNOSIS — Z3403 Encounter for supervision of normal first pregnancy, third trimester: Secondary | ICD-10-CM

## 2016-05-15 DIAGNOSIS — O26893 Other specified pregnancy related conditions, third trimester: Secondary | ICD-10-CM | POA: Diagnosis not present

## 2016-05-15 DIAGNOSIS — O26899 Other specified pregnancy related conditions, unspecified trimester: Secondary | ICD-10-CM

## 2016-05-15 DIAGNOSIS — Z3493 Encounter for supervision of normal pregnancy, unspecified, third trimester: Secondary | ICD-10-CM | POA: Diagnosis not present

## 2016-05-15 DIAGNOSIS — Z113 Encounter for screening for infections with a predominantly sexual mode of transmission: Secondary | ICD-10-CM | POA: Diagnosis not present

## 2016-05-15 DIAGNOSIS — Z3483 Encounter for supervision of other normal pregnancy, third trimester: Secondary | ICD-10-CM

## 2016-05-15 DIAGNOSIS — O4703 False labor before 37 completed weeks of gestation, third trimester: Secondary | ICD-10-CM | POA: Diagnosis not present

## 2016-05-15 DIAGNOSIS — R102 Pelvic and perineal pain: Secondary | ICD-10-CM | POA: Diagnosis not present

## 2016-05-15 LAB — OB RESULTS CONSOLE GBS: STREP GROUP B AG: NEGATIVE

## 2016-05-15 LAB — OB RESULTS CONSOLE GC/CHLAMYDIA: GC PROBE AMP, GENITAL: NEGATIVE

## 2016-05-15 NOTE — Progress Notes (Signed)
   PRENATAL VISIT NOTE  Subjective:  Gloria Nunez is a 21 y.o. [phone] at [redacted]w[redacted]d being seen today for ongoing prenatal care.  She is currently monitored for the following issues for this low-risk pregnancy and has Language barrier; Choroid plexus cyst of fetus; Supervision of normal pregnancy; Pain of round ligament affecting pregnancy, antepartum; Preterm uterine contractions in third trimester, antepartum; and Supervision of low-risk pregnancy, third trimester on her problem list.  Patient reports no complaints.  Contractions: Irregular.  .  Movement: Present. Denies leaking of fluid.   The following portions of the patient's history were reviewed and updated as appropriate: allergies, current medications, past family history, past medical history, past social history, past surgical history and problem list. Problem list updated.  Objective:   Vitals:   05/15/16 0902  BP: (!) 105/54  Pulse: 79  Weight: 54.4 kg (119 lb 14.4 oz)    Fetal Status: Fetal Heart Rate (bpm): 140 Fundal Height: 36 cm Movement: Present     General:  Alert, oriented and cooperative. Patient is in no acute distress.  Skin: Skin is warm and dry. No rash noted.   Cardiovascular: Normal heart rate noted  Respiratory: Normal respiratory effort, no problems with respiration noted  Abdomen: Soft, gravid, appropriate for gestational age. Pain/Pressure: Present     Pelvic:  Cervical exam deferred        Extremities: Normal range of motion.  Edema: None  Mental Status: Normal mood and affect. Normal behavior. Normal judgment and thought content.   Assessment and Plan:  Pregnancy: [id number] at [redacted]w[redacted]d  1. Supervision of low-risk pregnancy, third trimester  - Culture, beta strep (group b only) - GC/Chlamydia Probe Amp  2. Encounter for supervision of normal first pregnancy in third trimester  3. Pain of round ligament affecting pregnancy, antepartum  4. Preterm uterine contractions in third trimester,  antepartum Patient denies contractions; has not needed to take her Procardia.   Preterm labor symptoms and general obstetric precautions including but not limited to vaginal bleeding, contractions, leaking of fluid and fetal movement were reviewed in detail with the patient. Please refer to After Visit Summary for other counseling recommendations.  Return in about 1 week (around 05/22/2016).   Marylene Land, CNM

## 2016-05-15 NOTE — Addendum Note (Signed)
Addended by: Kathee Delton on: 05/15/2016 04:50 PM   Modules accepted: Orders

## 2016-05-15 NOTE — Patient Instructions (Signed)

## 2016-05-16 LAB — GC/CHLAMYDIA PROBE AMP (~~LOC~~) NOT AT ARMC
CHLAMYDIA, DNA PROBE: NEGATIVE
Neisseria Gonorrhea: NEGATIVE

## 2016-05-19 LAB — CULTURE, BETA STREP (GROUP B ONLY): Strep Gp B Culture: NEGATIVE

## 2016-05-26 ENCOUNTER — Ambulatory Visit (INDEPENDENT_AMBULATORY_CARE_PROVIDER_SITE_OTHER): Payer: Medicaid Other | Admitting: Obstetrics and Gynecology

## 2016-05-26 VITALS — BP 116/48 | HR 72 | Wt 121.0 lb

## 2016-05-26 DIAGNOSIS — Z3493 Encounter for supervision of normal pregnancy, unspecified, third trimester: Secondary | ICD-10-CM

## 2016-05-26 DIAGNOSIS — Z789 Other specified health status: Secondary | ICD-10-CM

## 2016-05-26 NOTE — Progress Notes (Signed)
Prenatal Visit Note Date: 05/26/2016 Clinic: [facility]  Subjective:  Gloria Nunez is a 21 y.o. [id number] at [redacted]w[redacted]d being seen today for ongoing prenatal care.  She is currently monitored for the following issues for this low-risk pregnancy and has Language barrier; Choroid plexus cyst of fetus; Supervision of normal pregnancy; Pain of round ligament affecting pregnancy, antepartum; Preterm uterine contractions in third trimester, antepartum; and Supervision of low-risk pregnancy, third trimester on her problem list.  Patient reports pelvic pain and pressure and worse last night..   Contractions: Irregular. Vag. Bleeding: None.  Movement: Present. Denies leaking of fluid.   The following portions of the patient's history were reviewed and updated as appropriate: allergies, current medications, past family history, past medical history, past social history, past surgical history and problem list. Problem list updated.  Objective:   Vitals:   05/26/16 0831  BP: (!) 116/48  Pulse: 72  Weight: 121 lb (54.9 kg)    Fetal Status: Fetal Heart Rate (bpm): 131 Fundal Height: 36 cm Movement: Present  Presentation: Vertex  General:  Alert, oriented and cooperative. Patient is in no acute distress.  Skin: Skin is warm and dry. No rash noted.   Cardiovascular: Normal heart rate noted  Respiratory: Normal respiratory effort, no problems with respiration noted  Abdomen: Soft, gravid, appropriate for gestational age. Pain/Pressure: Present     Pelvic:  Cervical exam performed Dilation: 1 Effacement (%): 50 Station: -3  Extremities: Normal range of motion.     Mental Status: Normal mood and affect. Normal behavior. Normal judgment and thought content.   Urinalysis:      Assessment and Plan:  Pregnancy: [id number] at [redacted]w[redacted]d  1. Supervision of low-risk pregnancy, third trimester Routine care.   2. Language barrier Interpreter used. Patient speaks pretty good  english  Term labor symptoms and general obstetric precautions including but not limited to vaginal bleeding, contractions, leaking of fluid and fetal movement were reviewed in detail with the patient. Please refer to After Visit Summary for other counseling recommendations.  Return in about 1 week (around 06/02/2016) for 7-10d rob.   Braham Bing, MD

## 2016-05-27 ENCOUNTER — Encounter (HOSPITAL_COMMUNITY): Payer: Self-pay | Admitting: *Deleted

## 2016-05-27 ENCOUNTER — Inpatient Hospital Stay (HOSPITAL_COMMUNITY)
Admission: AD | Admit: 2016-05-27 | Discharge: 2016-05-28 | Disposition: A | Discharge status: Home / Self Care | Payer: Medicaid Other | Source: Ambulatory Visit | Attending: Obstetrics and Gynecology | Admitting: Obstetrics and Gynecology

## 2016-05-27 DIAGNOSIS — O4703 False labor before 37 completed weeks of gestation, third trimester: Secondary | ICD-10-CM

## 2016-05-27 DIAGNOSIS — Z3A00 Weeks of gestation of pregnancy not specified: Secondary | ICD-10-CM | POA: Insufficient documentation

## 2016-05-27 DIAGNOSIS — O471 False labor at or after 37 completed weeks of gestation: Secondary | ICD-10-CM

## 2016-05-27 NOTE — MAU Note (Signed)
Pt reports contractions and pressure.  

## 2016-05-28 DIAGNOSIS — Z3A00 Weeks of gestation of pregnancy not specified: Secondary | ICD-10-CM | POA: Diagnosis not present

## 2016-05-28 DIAGNOSIS — O471 False labor at or after 37 completed weeks of gestation: Secondary | ICD-10-CM | POA: Diagnosis not present

## 2016-05-28 NOTE — Discharge Instructions (Signed)
Contracciones de Braxton Hicks °(Braxton Hicks Contractions) °Durante el embarazo, pueden presentarse contracciones uterinas que no siempre indican que está en trabajo de parto. °¿QUÉ SON LAS CONTRACCIONES DE BRAXTON HICKS? °Las contracciones que se presentan antes del trabajo de parto se conocen como contracciones de Braxton Hicks o falso trabajo de parto. Hacia el final del embarazo (32 a 34 semanas), estas contracciones pueden aparecen con más frecuencia y volverse más intensas. No corresponden al trabajo de parto verdadero porque estas contracciones no producen el agrandamiento (la dilatación) y el afinamiento del cuello del útero. Algunas veces, es difícil distinguirlas del trabajo de parto verdadero porque en algunos casos pueden ser muy intensas, y las personas tienen diferentes niveles de tolerancia al dolor. No debe sentirse avergonzada si concurre al hospital con falso trabajo de parto. En ocasiones, la única forma de saber si el trabajo de parto es verdadero es que el médico determine si hay cambios en el cuello del útero. °Si no hay problemas prenatales u otras complicaciones de salud asociadas con el embarazo, no habrá inconvenientes si la envían a su casa con falso trabajo de parto y espera que comience el verdadero. °CÓMO DIFERENCIAR EL TRABAJO DE PARTO FALSO DEL VERDADERO °Falso trabajo de parto  °· Las contracciones del falso trabajo de parto duran menos y no son tan intensas como las verdaderas. °· Generalmente son irregulares. °· A menudo, se sienten en la parte delantera de la parte baja del abdomen y en la ingle, °· y pueden desaparecer cuando camina o cambia de posición mientras está acostada. °· Las contracciones se vuelven más débiles y su duración es menor a medida que el tiempo transcurre. °· Por lo general, no se hacen progresivamente más intensas, regulares y cercanas entre sí como en el caso del trabajo de parto verdadero. °Verdadero trabajo de parto  °· Las contracciones del verdadero  trabajo de parto duran de 30 a 70 segundos, son muy regulares y suelen volverse más intensas, y aumenta su frecuencia. °· No desaparecen cuando camina. °· La molestia generalmente se siente en la parte superior del útero y se extiende hacia la zona inferior del abdomen y hacia la cintura. °· El médico podrá examinarla para determinar si el trabajo de parto es verdadero. El examen mostrará si el cuello del útero se está dilatando y afinando. °LO QUE DEBE RECORDAR °· Continúe haciendo los ejercicios habituales y siga otras indicaciones que el médico le dé. °· Tome todos los medicamentos como le indicó el médico. °· Concurra a las visitas prenatales regulares. °· Coma y beba con moderación si cree que está en trabajo de parto. °· Si las contracciones de Braxton Hicks le provocan incomodidad: °¨ Cambie de posición: si está acostada o descansando, camine; si está caminando, descanse. °¨ Siéntese y descanse en una bañera con agua tibia. °¨ Beba 2 o 3 vasos de agua. La deshidratación puede provocar contracciones. °¨ Respire lenta y profundamente varias veces por hora. °¿CUÁNDO DEBO BUSCAR ASISTENCIA MÉDICA INMEDIATA? °Solicite atención médica de inmediato si: °· Las contracciones se intensifican, se hacen más regulares y cercanas entre sí. °· Tiene una pérdida de líquido por la vagina. °· Tiene fiebre. °· Elimina mucosidad manchada con sangre. °· Tiene una hemorragia vaginal abundante. °· Tiene dolor abdominal permanente. °· Tiene un dolor en la zona lumbar que nunca tuvo antes. °· Siente que la cabeza del bebé empuja hacia abajo y ejerce presión en la zona pélvica. °· El bebé no se mueve tanto como solía. °Esta información no tiene como fin reemplazar el   consejo del médico. Asegúrese de hacerle al médico cualquier pregunta que tenga. °Document Released: 10/23/2004 Document Revised: 05/07/2015 Document Reviewed: 10/25/2012 °Elsevier Interactive Patient Education © 2017 [employer] Inc[employer] ° °

## 2016-05-28 NOTE — MAU Note (Signed)
I have communicated with Philipp Deputy, CNM and reviewed vital signs:  Vitals:   05/27/16 2308  BP: (!) 112/58  Pulse: 75  Resp: 17  Temp: 98.1 F (36.7 C)    Vaginal exam:  Dilation: 1 Effacement (%): 50 Cervical Position: Posterior Station: -3 Presentation: Vertex Exam by:: Danella Deis, RN,   Also reviewed contraction pattern and that non-stress test is reactive.  It has been documented that patient is contracting every 3-13 minutes with no cervical change over 2 hours not indicating active labor.  Patient denies any other complaints.  Based on this report provider has given order for discharge.  A discharge order and diagnosis entered by a provider.   Labor discharge instructions reviewed with patient.

## 2016-05-30 ENCOUNTER — Inpatient Hospital Stay (HOSPITAL_COMMUNITY)
Admission: AD | Admit: 2016-05-30 | Discharge: 2016-05-31 | Disposition: A | Discharge status: Home / Self Care | Payer: Medicaid Other | Source: Ambulatory Visit | Attending: Obstetrics and Gynecology | Admitting: Obstetrics and Gynecology

## 2016-05-30 ENCOUNTER — Encounter (HOSPITAL_COMMUNITY): Payer: Self-pay

## 2016-05-30 DIAGNOSIS — Z658 Other specified problems related to psychosocial circumstances: Secondary | ICD-10-CM

## 2016-05-30 DIAGNOSIS — O479 False labor, unspecified: Secondary | ICD-10-CM | POA: Diagnosis present

## 2016-05-30 DIAGNOSIS — F064 Anxiety disorder due to known physiological condition: Secondary | ICD-10-CM | POA: Diagnosis not present

## 2016-05-30 DIAGNOSIS — O26893 Other specified pregnancy related conditions, third trimester: Secondary | ICD-10-CM | POA: Diagnosis present

## 2016-05-30 DIAGNOSIS — O99343 Other mental disorders complicating pregnancy, third trimester: Secondary | ICD-10-CM | POA: Diagnosis not present

## 2016-05-30 DIAGNOSIS — O471 False labor at or after 37 completed weeks of gestation: Secondary | ICD-10-CM | POA: Insufficient documentation

## 2016-05-30 DIAGNOSIS — M549 Dorsalgia, unspecified: Secondary | ICD-10-CM | POA: Diagnosis present

## 2016-05-30 DIAGNOSIS — Z3A37 37 weeks gestation of pregnancy: Secondary | ICD-10-CM | POA: Diagnosis not present

## 2016-05-30 DIAGNOSIS — O4703 False labor before 37 completed weeks of gestation, third trimester: Secondary | ICD-10-CM

## 2016-05-30 MED ORDER — ZOLPIDEM TARTRATE 5 MG PO TABS
5.0000 mg | ORAL_TABLET | Freq: Once | ORAL | Status: AC
  Administered 2016-05-30: 5 mg via ORAL
  Filled 2016-05-30: qty 1

## 2016-05-30 MED ORDER — ZOLPIDEM TARTRATE 5 MG PO TABS: 5.0000 mg | ORAL_TABLET | Freq: Every evening | ORAL | 0 refills | Status: DC | PRN

## 2016-05-30 NOTE — MAU Note (Signed)
Having a lot of pelvic pressure since 1100. Baby is pushing down a lot. Stronger for last 3 hours. Denies LOF or bleeding. 1 cm last sve.

## 2016-05-30 NOTE — Progress Notes (Signed)
Admissions person Amy reported pt's husband came to pick her up and husband inquiring why pt was not admitted because contractions were taking her breath away.  I notified Marylee FlorasFran Cres-Dishmon CNM to further discuss discharge instructions and plan of care.  Explained to pt and husband that will come down but is in delivery right now.

## 2016-05-30 NOTE — Progress Notes (Signed)
Per admissions, pt's husband request pt's discharge instructions in English for their attorney.  Manus Gunning CNM in delivery, notified Fatima Blank CNM and Blain Pais, RN house coverage met with pt few minutes and husband to encourage pt to come back into MAU and will reassess for labor.  Pt's husband request physician recheck pt.

## 2016-05-30 NOTE — MAU Provider Note (Signed)
Obstetric Attending MAU Note  Chief Complaint:  Pelvic Pain and Back Pain   None    HPI: Gloria Nunez is a 21 y.o. [phone] at [redacted]w[redacted]d who presents to maternity admissions reporting pelvic pressure sx, pt is home alone daily and highly anxious when she gets acute pressure sx, feels she cannot breathe, and husband, is equally anxious that she is okay.. Denies contractions, leakage of fluid or vaginal bleeding. Good fetal movement.  Serial exams by [facility] and then confirmed by me show NO cervical change. Pregnancy Course: Receives care at [facility] clinic. Patient Active Problem List   Diagnosis Date Noted  . Pain of round ligament affecting pregnancy, antepartum 02/06/2016    Priority: Low  . Supervision of low-risk pregnancy, third trimester 05/15/2016  . Preterm uterine contractions in third trimester, antepartum 04/17/2016  . Supervision of normal pregnancy 12/26/2015  . Choroid plexus cyst of fetus 06/07/2014  . Language barrier 03/31/2014    Past Medical History:  Diagnosis Date  . Medical history non-contributory     OB History  Gravida Para Term Preterm AB Living  4 2 2   1 2   SAB TAB Ectopic Multiple Live Births  1     0 2    # Outcome Date GA Lbr Len/2nd Weight Sex Delivery Anes PTL Lv  4 Current           3 Term 09/09/14 [redacted]w[redacted]d 12:52 / 00:13 3.011 kg (6 lb 10.2 oz) M Vag-Spont None  LIV  2 Term 12/03/12 [redacted]w[redacted]d   M Vag-Spont   LIV  1 SAB  [redacted]w[redacted]d             Past Surgical History:  Procedure Laterality Date  . NO PAST SURGERIES      Family History: Family History  Problem Relation Age of Onset  . Diabetes Maternal Grandfather   . Heart disease Maternal Grandfather     Social History: Social History  Substance Use Topics  . Smoking status: Never Smoker  . Smokeless tobacco: Never Used  . Alcohol use No    Allergies:  Allergies  Allergen Reactions  . Other Nausea And Vomiting and Other (See Comments)    Pt states that she is allergic to mayonnaise.       Prescriptions Prior to Admission  Medication Sig Dispense Refill Last Dose  . Prenatal Vit-Fe Fumarate-FA (PRENATAL MULTIVITAMIN) TABS tablet Take 1 tablet by mouth daily.   05/30/2016 at Unknown time    ROS: Pertinent findings in history of present illness.  Physical Exam  Blood pressure (!) 107/58, pulse 74, temperature 98.1 F (36.7 C), temperature source Oral, resp. rate 16, height 5\' 5"  (1.651 m), weight 54.9 kg (121 lb), last menstrual period 09/03/2015, unknown if currently breastfeeding. CONSTITUTIONAL: Well-developed, well-nourished female in no acute distress.  HENT:  Normocephalic, atraumatic, External right and left ear normal. Oropharynx is clear and moist EYES: Conjunctivae and EOM are normal. Pupils are equal, round, and reactive to light. No scleral icterus.  NECK: Normal range of motion, supple, no masses SKIN: Skin is warm and dry. No rash noted. Not diaphoretic. No erythema. No pallor. NEUROLGIC: Alert and oriented to person, place, and time. Normal reflexes, muscle tone coordination. No cranial nerve deficit noted. PSYCHIATRIC: Normal mood and affect. Normal behavior. Normal judgment and thought content. CARDIOVASCULAR: Normal heart rate noted, regular rhythm RESPIRATORY: Effort and breath sounds normal, no problems with respiration noted ABDOMEN: Soft, nontender, nondistended, gravid appropriate for gestational age MUSCULOSKELETAL: Normal range of motion. No  edema and no tenderness. 2+ distal pulses.  SPECULUM EXAM: NEFG, physiologic discharge, no blood, cervix clean Dilation: 1.5 Effacement (%): 50 Cervical Position: Posterior Station: -3 Presentation: Vertex Exam by:: Avery DennisonBenji Stanley RN  FHT:  Baseline 135 , moderate variability, accelerations present, no decelerations Contractions: q mild irregular q5-9 mins resolved during the evaluation. I spent 15+ mins with pt and no contractions that pt felt during that time   Labs: No results found for this or any  previous visit (from the past 24 hour(s)).  Imaging:  No results found.  MAU Course: NsT reactive. No decels. Serial exams no cervix change Last exam by me.pef Physical Examination: General appearance - alert, well appearing, and in no distress, oriented to person, place, and time, normal appearing weight and anxious Mental status - alert, oriented to person, place, and time, normal mood, behavior, speech, dress, motor activity, and thought processes, anxious Eyes - pupils equal and reactive, extraocular eye movements intact Abdomen - soft, nontender, nondistended, no masses or organomegaly Large gravid uterus, c/w dates in petite individual, pt states that she was larger with prior pregnancies. Pelvic - VULVA: normal appearing vulva with no masses, tenderness or lesions, VAGINA: normal appearing vagina with normal color and discharge, no lesions, . , CERVIX: 1.5 cm posterior firm -2 vertex   Assessment: 1. False labor   2. Preterm uterine contractions in third trimester, antepartum   3    Anxiety due to social issues, lack of support, husband works long hours, no family assistance in this state.  Plan: Discharge home Labor precautions and fetal kick counts reviewed Follow up with OB provider rx Ambien 5 mg now , husband off work tomorrow.    Tilda BurrowFerguson, Breslin Hemann V, MD 05/30/2016 11:41 PM

## 2016-05-30 NOTE — MAU Note (Signed)
I have communicated with Marylee FlorasFran Cres-Dishmon CNM and reviewed vital signs:  Vitals:   05/30/16 2053 05/30/16 2113  BP: (!) 102/53 (!) 107/58  Pulse: 78 74  Resp: 16 16  Temp: 98.6 F (37 C) 98.1 F (36.7 C)    Vaginal exam:  Dilation: 1.5 Effacement (%): 50 Cervical Position: Posterior Station: -3 Presentation: Vertex Exam by:: Avery DennisonBenji Moris Ratchford RN,   Also reviewed contraction pattern and that non-stress test is reactive.  It has been documented that patient is contracting every 2-6 minutes with minimal cervical change since last cervical exam not indicating active labor.  Patient denies any other complaints.  Based on this report provider has given order for discharge.  A discharge order and diagnosis entered by a provider.  Labor discharge instructions reviewed with patient.

## 2016-05-30 NOTE — Discharge Instructions (Signed)

## 2016-05-31 NOTE — Progress Notes (Signed)
Drenda FreezeFran CNM in MAU and notified Dr Emelda FearFerguson.  Dr Emelda FearFerguson will see pt in MAU.

## 2016-06-01 ENCOUNTER — Inpatient Hospital Stay (HOSPITAL_COMMUNITY)
Admission: AD | Admit: 2016-06-01 | Discharge: 2016-06-01 | Disposition: A | Discharge status: Home / Self Care | Payer: Medicaid Other | Source: Ambulatory Visit | Attending: Obstetrics and Gynecology | Admitting: Obstetrics and Gynecology

## 2016-06-01 ENCOUNTER — Encounter (HOSPITAL_COMMUNITY): Payer: Self-pay | Admitting: *Deleted

## 2016-06-01 DIAGNOSIS — O479 False labor, unspecified: Secondary | ICD-10-CM

## 2016-06-01 NOTE — Discharge Instructions (Signed)
Fetal Movement Counts Patient Name: ________________________________________________ Patient Due Date: ____________________ What is a fetal movement count? A fetal movement count is the number of times that you feel your baby move during a certain amount of time. This may also be called a fetal kick count. A fetal movement count is recommended for every pregnant woman. You may be asked to start counting fetal movements as early as week 28 of your pregnancy. Pay attention to when your baby is most active. You may notice your baby's sleep and wake cycles. You may also notice things that make your baby move more. You should do a fetal movement count:  When your baby is normally most active.  At the same time each day. A good time to count movements is while you are resting, after having something to eat and drink. How do I count fetal movements? 1. Find a quiet, comfortable area. Sit, or lie down on your side. 2. Write down the date, the start time and stop time, and the number of movements that you felt between those two times. Take this information with you to your health care visits. 3. For 2 hours, count kicks, flutters, swishes, rolls, and jabs. You should feel at least 10 movements during 2 hours. 4. You may stop counting after you have felt 10 movements. 5. If you do not feel 10 movements in 2 hours, have something to eat and drink. Then, keep resting and counting for 1 hour. If you feel at least 4 movements during that hour, you may stop counting. Contact a health care provider if:  You feel fewer than 4 movements in 2 hours.  Your baby is not moving like he or she usually does. Date: ____________ Start time: ____________ Stop time: ____________ Movements: ____________ Date: ____________ Start time: ____________ Stop time: ____________ Movements: ____________ Date: ____________ Start time: ____________ Stop time: ____________ Movements: ____________ Date: ____________ Start time:  ____________ Stop time: ____________ Movements: ____________ Date: ____________ Start time: ____________ Stop time: ____________ Movements: ____________ Date: ____________ Start time: ____________ Stop time: ____________ Movements: ____________ Date: ____________ Start time: ____________ Stop time: ____________ Movements: ____________ Date: ____________ Start time: ____________ Stop time: ____________ Movements: ____________ Date: ____________ Start time: ____________ Stop time: ____________ Movements: ____________ This information is not intended to replace advice given to you by your health care provider. Make sure you discuss any questions you have with your health care provider. Document Released: 02/12/2006 Document Revised: 09/12/2015 Document Reviewed: 02/22/2015 Elsevier Interactive Patient Education  2017 Else[employer] Braxton Hicks Contractions Contractions of the uterus can occur throughout pregnancy, but they are not always a sign that you are in labor. You may have practice contractions called Braxton Hicks contractions. These false labor contractions are sometimes confused with true labor. What are Braxton Hicks contractions? Braxton Hicks contractions are tightening movements that occur in the muscles of the uterus before labor. Unlike true labor contractions, these contractions do not result in opening (dilation) and thinning of the cervix. Toward the end of pregnancy (32-34 weeks), Braxton Hicks contractions can happen more often and may become stronger. These contractions are sometimes difficult to tell apart from true labor because they can be very uncomfortable. You should not feel embarrassed if you go to the hospital with false labor. Sometimes, the only way to tell if you are in true labor is for your health care provider to look for changes in the cervix. The health care provider will do a physical exam and may monitor your contractions. If you   are not in true labor, the exam  should show that your cervix is not dilating and your water has not broken. If there are no prenatal problems or other health problems associated with your pregnancy, it is completely safe for you to be sent home with false labor. You may continue to have Braxton Hicks contractions until you go into true labor. How can I tell the difference between true labor and false labor?  Differences  False labor  Contractions last 30-70 seconds.: Contractions are usually shorter and not as strong as true labor contractions.  Contractions become very regular.: Contractions are usually irregular.  Discomfort is usually felt in the top of the uterus, and it spreads to the lower abdomen and low back.: Contractions are often felt in the front of the lower abdomen and in the groin.  Contractions do not go away with walking.: Contractions may go away when you walk around or change positions while lying down.  Contractions usually become more intense and increase in frequency.: Contractions get weaker and are shorter-lasting as time goes on.  The cervix dilates and gets thinner.: The cervix usually does not dilate or become thin. Follow these instructions at home:  Take over-the-counter and prescription medicines only as told by your health care provider.  Keep up with your usual exercises and follow other instructions from your health care provider.  Eat and drink lightly if you think you are going into labor.  If Braxton Hicks contractions are making you uncomfortable:  Change your position from lying down or resting to walking, or change from walking to resting.  Sit and rest in a tub of warm water.  Drink enough fluid to keep your urine clear or pale yellow. Dehydration may cause these contractions.  Do slow and deep breathing several times an hour.  Keep all follow-up prenatal visits as told by your health care provider. This is important. Contact a health care provider if:  You have a  fever.  You have continuous pain in your abdomen. Get help right away if:  Your contractions become stronger, more regular, and closer together.  You have fluid leaking or gushing from your vagina.  You pass blood-tinged mucus (bloody show).  You have bleeding from your vagina.  You have low back pain that you never had before.  You feel your baby's head pushing down and causing pelvic pressure.  Your baby is not moving inside you as much as it used to. Summary  Contractions that occur before labor are called Braxton Hicks contractions, false labor, or practice contractions.  Braxton Hicks contractions are usually shorter, weaker, farther apart, and less regular than true labor contractions. True labor contractions usually become progressively stronger and regular and they become more frequent.  Manage discomfort from Braxton Hicks contractions by changing position, resting in a warm bath, drinking plenty of water, or practicing deep breathing. This information is not intended to replace advice given to you by your health care provider. Make sure you discuss any questions you have with your health care provider. Document Released: 01/13/2005 Document Revised: 12/03/2015 Document Reviewed: 12/03/2015 Elsevier Interactive Patient Education  2017 Elsevier Inc.  

## 2016-06-01 NOTE — MAU Note (Signed)
Patient presents to mau with c/o contractions every 2-3 minutes. Denies LOF or VB at this time. Does states she lost her mucus plug. +FM. Endorses being 1.5 cm on Friday.

## 2016-06-01 NOTE — MAU Note (Addendum)
I have communicated with Antony Odeaaroline Neil, CNM-student/Darlene Hart RochesterLawson, CNM and reviewed vital signs:  Vitals:   06/01/16 1106  BP: (!) 113/57  Pulse: 68  Resp: 18  Temp: 98.4 F (36.9 C)    Vaginal exam:  Dilation: 1.5 Effacement (%): 50 Cervical Position: Posterior Station: -3 Presentation: Vertex Exam by:: Janeth Rasehristina Robinson RNC,   Also reviewed contraction pattern and that non-stress test is reactive.  It has been documented that patient is contracting every 5-6 minutes with no cervical change since last cervical check on Friday not indicating active labor.  Patient denies any other complaints.  Based on this report provider has given order for discharge.  A discharge order and diagnosis entered by a provider.   Labor discharge instructions reviewed with patient.      1206pm: RN to discharge patient. Patient states she having an increase in pain an request to be rechecked/stay. VE 2-3cm/50/-3/middle. Patient ambulating.   1207--Left message for Antony Odeaaroline Neil, CNM -student to call back for update.   1220-Caroline updated on patient request; will come see patient.  1225--Caroline bedside discussing discharge. Patient questions and concerns addressed. Labor precautions reviewed. Patient discharged.

## 2016-06-02 ENCOUNTER — Inpatient Hospital Stay (HOSPITAL_COMMUNITY): Payer: Medicaid Other | Admitting: Anesthesiology

## 2016-06-02 ENCOUNTER — Encounter (HOSPITAL_COMMUNITY): Payer: Self-pay

## 2016-06-02 ENCOUNTER — Inpatient Hospital Stay (HOSPITAL_COMMUNITY)
Admission: AD | Admit: 2016-06-02 | Discharge: 2016-06-03 | DRG: 775 | Disposition: A | Discharge status: Home / Self Care | Payer: Medicaid Other | Source: Ambulatory Visit | Attending: Obstetrics and Gynecology | Admitting: Obstetrics and Gynecology

## 2016-06-02 ENCOUNTER — Encounter: Payer: Medicaid Other | Admitting: Family Medicine

## 2016-06-02 DIAGNOSIS — D649 Anemia, unspecified: Secondary | ICD-10-CM | POA: Diagnosis present

## 2016-06-02 DIAGNOSIS — O4290 Premature rupture of membranes, unspecified as to length of time between rupture and onset of labor, unspecified weeks of gestation: Secondary | ICD-10-CM | POA: Diagnosis present

## 2016-06-02 DIAGNOSIS — Z833 Family history of diabetes mellitus: Secondary | ICD-10-CM

## 2016-06-02 DIAGNOSIS — Z3A38 38 weeks gestation of pregnancy: Secondary | ICD-10-CM

## 2016-06-02 DIAGNOSIS — O9902 Anemia complicating childbirth: Secondary | ICD-10-CM | POA: Diagnosis present

## 2016-06-02 DIAGNOSIS — O4202 Full-term premature rupture of membranes, onset of labor within 24 hours of rupture: Principal | ICD-10-CM | POA: Diagnosis present

## 2016-06-02 DIAGNOSIS — O4703 False labor before 37 completed weeks of gestation, third trimester: Secondary | ICD-10-CM

## 2016-06-02 DIAGNOSIS — Z8249 Family history of ischemic heart disease and other diseases of the circulatory system: Secondary | ICD-10-CM

## 2016-06-02 DIAGNOSIS — Z3483 Encounter for supervision of other normal pregnancy, third trimester: Secondary | ICD-10-CM

## 2016-06-02 LAB — CBC
HEMATOCRIT: 27 % — AB (ref 36.0–46.0)
HEMOGLOBIN: 8.7 g/dL — AB (ref 12.0–15.0)
MCH: 28.4 pg (ref 26.0–34.0)
MCHC: 32.2 g/dL (ref 30.0–36.0)
MCV: 88.2 fL (ref 78.0–100.0)
Platelets: 228 10*3/uL (ref 150–400)
RBC: 3.06 MIL/uL — ABNORMAL LOW (ref 3.87–5.11)
RDW: 14.2 % (ref 11.5–15.5)
WBC: 6.3 10*3/uL (ref 4.0–10.5)

## 2016-06-02 LAB — TYPE AND SCREEN
ABO/RH(D): B POS
ANTIBODY SCREEN: NEGATIVE

## 2016-06-02 LAB — RPR: RPR: NONREACTIVE

## 2016-06-02 LAB — POCT FERN TEST: POCT FERN TEST: POSITIVE

## 2016-06-02 MED ORDER — PHENYLEPHRINE 40 MCG/ML (10ML) SYRINGE FOR IV PUSH (FOR BLOOD PRESSURE SUPPORT)
PREFILLED_SYRINGE | INTRAVENOUS | Status: AC
  Filled 2016-06-02: qty 10

## 2016-06-02 MED ORDER — BENZOCAINE-MENTHOL 20-0.5 % EX AERO
1.0000 "application " | INHALATION_SPRAY | CUTANEOUS | Status: DC | PRN
  Administered 2016-06-03: 1 via TOPICAL
  Filled 2016-06-02: qty 56

## 2016-06-02 MED ORDER — PRENATAL MULTIVITAMIN CH
1.0000 | ORAL_TABLET | Freq: Every day | ORAL | Status: DC
  Filled 2016-06-02: qty 1

## 2016-06-02 MED ORDER — DIPHENHYDRAMINE HCL 50 MG/ML IJ SOLN: 12.5000 mg | INTRAMUSCULAR | Status: DC | PRN

## 2016-06-02 MED ORDER — EPHEDRINE 5 MG/ML INJ
10.0000 mg | INTRAVENOUS | Status: DC | PRN
  Filled 2016-06-02: qty 2

## 2016-06-02 MED ORDER — ACETAMINOPHEN 160 MG/5ML PO SOLN: 650.0000 mg | ORAL | Status: DC | PRN

## 2016-06-02 MED ORDER — ACETAMINOPHEN 325 MG PO TABS
650.0000 mg | ORAL_TABLET | ORAL | Status: DC | PRN
  Administered 2016-06-02: 650 mg via ORAL
  Filled 2016-06-02: qty 2

## 2016-06-02 MED ORDER — LIDOCAINE HCL (PF) 1 % IJ SOLN
INTRAMUSCULAR | Status: DC | PRN
  Administered 2016-06-02 (×2): 5 mL via EPIDURAL

## 2016-06-02 MED ORDER — TERBUTALINE SULFATE 1 MG/ML IJ SOLN
0.2500 mg | Freq: Once | INTRAMUSCULAR | Status: DC | PRN
  Filled 2016-06-02: qty 1

## 2016-06-02 MED ORDER — OXYTOCIN 40 UNITS IN LACTATED RINGERS INFUSION - SIMPLE MED
2.5000 [IU]/h | INTRAVENOUS | Status: DC
  Filled 2016-06-02: qty 1000

## 2016-06-02 MED ORDER — LACTATED RINGERS IV SOLN: 500.0000 mL | INTRAVENOUS | Status: DC | PRN

## 2016-06-02 MED ORDER — PHENYLEPHRINE 40 MCG/ML (10ML) SYRINGE FOR IV PUSH (FOR BLOOD PRESSURE SUPPORT)
80.0000 ug | PREFILLED_SYRINGE | INTRAVENOUS | Status: DC | PRN
  Filled 2016-06-02: qty 5

## 2016-06-02 MED ORDER — LACTATED RINGERS IV SOLN: 500.0000 mL | Freq: Once | INTRAVENOUS | Status: DC

## 2016-06-02 MED ORDER — SENNOSIDES-DOCUSATE SODIUM 8.6-50 MG PO TABS
2.0000 | ORAL_TABLET | ORAL | Status: DC
  Administered 2016-06-03: 2 via ORAL
  Filled 2016-06-02: qty 2

## 2016-06-02 MED ORDER — ZOLPIDEM TARTRATE 5 MG PO TABS: 5.0000 mg | ORAL_TABLET | Freq: Every evening | ORAL | Status: DC | PRN

## 2016-06-02 MED ORDER — LACTATED RINGERS IV SOLN
500.0000 mL | Freq: Once | INTRAVENOUS | Status: AC
  Administered 2016-06-02: 500 mL via INTRAVENOUS

## 2016-06-02 MED ORDER — SOD CITRATE-CITRIC ACID 500-334 MG/5ML PO SOLN: 30.0000 mL | ORAL | Status: DC | PRN

## 2016-06-02 MED ORDER — LIDOCAINE HCL (PF) 1 % IJ SOLN
30.0000 mL | INTRAMUSCULAR | Status: DC | PRN
  Filled 2016-06-02: qty 30

## 2016-06-02 MED ORDER — FENTANYL CITRATE (PF) 100 MCG/2ML IJ SOLN: 100.0000 ug | INTRAMUSCULAR | Status: DC | PRN

## 2016-06-02 MED ORDER — SIMETHICONE 80 MG PO CHEW: 80.0000 mg | CHEWABLE_TABLET | ORAL | Status: DC | PRN

## 2016-06-02 MED ORDER — FENTANYL 2.5 MCG/ML BUPIVACAINE 1/10 % EPIDURAL INFUSION (WH - ANES)
INTRAMUSCULAR | Status: AC
  Filled 2016-06-02: qty 100

## 2016-06-02 MED ORDER — LACTATED RINGERS IV SOLN
INTRAVENOUS | Status: DC
  Administered 2016-06-02 (×2): via INTRAVENOUS

## 2016-06-02 MED ORDER — TETANUS-DIPHTH-ACELL PERTUSSIS 5-2.5-18.5 LF-MCG/0.5 IM SUSP: 0.5000 mL | Freq: Once | INTRAMUSCULAR | Status: DC

## 2016-06-02 MED ORDER — ONDANSETRON HCL 4 MG/2ML IJ SOLN
4.0000 mg | Freq: Four times a day (QID) | INTRAMUSCULAR | Status: DC | PRN
  Administered 2016-06-02: 4 mg via INTRAVENOUS
  Filled 2016-06-02: qty 2

## 2016-06-02 MED ORDER — OXYCODONE-ACETAMINOPHEN 5-325 MG PO TABS: 1.0000 | ORAL_TABLET | ORAL | Status: DC | PRN

## 2016-06-02 MED ORDER — COCONUT OIL OIL: 1.0000 "application " | TOPICAL_OIL | Status: DC | PRN

## 2016-06-02 MED ORDER — DIPHENHYDRAMINE HCL 25 MG PO CAPS: 25.0000 mg | ORAL_CAPSULE | Freq: Four times a day (QID) | ORAL | Status: DC | PRN

## 2016-06-02 MED ORDER — IBUPROFEN 600 MG PO TABS
600.0000 mg | ORAL_TABLET | Freq: Four times a day (QID) | ORAL | Status: DC
  Administered 2016-06-02: 600 mg via ORAL
  Filled 2016-06-02: qty 1

## 2016-06-02 MED ORDER — OXYTOCIN 40 UNITS IN LACTATED RINGERS INFUSION - SIMPLE MED: 1.0000 m[IU]/min | INTRAVENOUS | Status: DC

## 2016-06-02 MED ORDER — IBUPROFEN 100 MG/5ML PO SUSP
600.0000 mg | Freq: Four times a day (QID) | ORAL | Status: DC
  Administered 2016-06-03: 600 mg via ORAL
  Filled 2016-06-02 (×3): qty 30

## 2016-06-02 MED ORDER — ACETAMINOPHEN 325 MG PO TABS: 650.0000 mg | ORAL_TABLET | ORAL | Status: DC | PRN

## 2016-06-02 MED ORDER — WITCH HAZEL-GLYCERIN EX PADS: 1.0000 "application " | MEDICATED_PAD | CUTANEOUS | Status: DC | PRN

## 2016-06-02 MED ORDER — ONDANSETRON HCL 4 MG PO TABS: 4.0000 mg | ORAL_TABLET | ORAL | Status: DC | PRN

## 2016-06-02 MED ORDER — OXYTOCIN BOLUS FROM INFUSION
500.0000 mL | Freq: Once | INTRAVENOUS | Status: AC
  Administered 2016-06-02: 500 mL via INTRAVENOUS

## 2016-06-02 MED ORDER — ONDANSETRON HCL 4 MG/2ML IJ SOLN: 4.0000 mg | INTRAMUSCULAR | Status: DC | PRN

## 2016-06-02 MED ORDER — DIBUCAINE 1 % RE OINT: 1.0000 "application " | TOPICAL_OINTMENT | RECTAL | Status: DC | PRN

## 2016-06-02 MED ORDER — OXYCODONE-ACETAMINOPHEN 5-325 MG PO TABS: 2.0000 | ORAL_TABLET | ORAL | Status: DC | PRN

## 2016-06-02 MED ORDER — FENTANYL 2.5 MCG/ML BUPIVACAINE 1/10 % EPIDURAL INFUSION (WH - ANES)
14.0000 mL/h | INTRAMUSCULAR | Status: DC | PRN
  Administered 2016-06-02: 12 mL/h via EPIDURAL

## 2016-06-02 NOTE — MAU Note (Signed)
Pt states water broke at 0100. Contractions every 2 mins. Pt denies vag bleeding. +FM Cervix was 2-3cm on last exam.

## 2016-06-02 NOTE — Progress Notes (Signed)
LABOR PROGRESS NOTE  Bernerd LimboKaren Santiago-Duran is a 21 y.o. [phone] at 981w1d  admitted for SOL  Subjective: Patient is doing well, pain is well controlled.   Objective: BP (!) 102/58   Pulse 73   Temp 98 F (36.7 C) (Oral)   Resp 18   LMP 09/03/2015 (LMP Unknown)   SpO2 98%  or  Vitals:   06/02/16 0832 06/02/16 0901 06/02/16 0931 06/02/16 1001  BP: (!) 96/56 (!) 101/53 (!) 105/55 (!) 102/58  Pulse: 81 69 71 73  Resp: 18 18 18 18   Temp:      TempSrc:      SpO2:        Dilation: 5 Effacement (%): 80, 90 Cervical Position: Middle Station: 0 Presentation: Vertex Exam by:: A Showfety RN  Labs: Lab Results  Component Value Date   WBC 6.3 06/02/2016   HGB 8.7 (L) 06/02/2016   HCT 27.0 (L) 06/02/2016   MCV 88.2 06/02/2016   PLT 228 06/02/2016    Patient Active Problem List   Diagnosis Date Noted  . Delayed delivery after SROM (spontaneous rupture of membranes) 06/02/2016  . Insufficient social support 05/30/2016  . False labor 05/30/2016  . Supervision of low-risk pregnancy, third trimester 05/15/2016  . Preterm uterine contractions in third trimester, antepartum 04/17/2016  . Pain of round ligament affecting pregnancy, antepartum 02/06/2016  . Supervision of normal pregnancy 12/26/2015  . Choroid plexus cyst of fetus 06/07/2014  . Language barrier 03/31/2014    Assessment / Plan: 21 y.o. [id number] at 481w1d here for SOL. Patient with minimal cervical change since admission. Continue to have regular contractions and FH tracing have been appropriate. Will consider pitocin if labor does not progress as expected.  Labor: expectant management Fetal Wellbeing: Cat 1 Pain Control:  Epidurall Anticipated MOD: SVD  Lovena NeighboursAbdoulaye Keven Osborn, MD 06/02/2016, 10:11 AM

## 2016-06-02 NOTE — H&P (Signed)
LABOR AND DELIVERY ADMISSION HISTORY AND PHYSICAL NOTE  Gloria Nunez is a 21 y.o. female [phone] with IUP at [redacted]w[redacted]d by 8 wk [place] presenting for SROM at 1AM. She is also having contractions every 5 minutes.   She reports positive fetal movement. She denies leakage of fluid or vaginal bleeding.  Prenatal History/Complications:  Past Medical History: Past Medical History:  Diagnosis Date  . Medical history non-contributory     Past Surgical History: Past Surgical History:  Procedure Laterality Date  . NO PAST SURGERIES      Obstetrical History: OB History    Gravida Para Term Preterm AB Living   4 2 2   1 2    SAB TAB Ectopic Multiple Live Births   1     0 2      Social History: Social History   Social History  . Marital status: Single    Spouse name: N/A  . Number of children: N/A  . Years of education: N/A   Social History Main Topics  . Smoking status: Never Smoker  . Smokeless tobacco: Never Used  . Alcohol use No  . Drug use: No  . Sexual activity: Yes    Birth control/ protection: None   Other Topics Concern  . None   Social History Narrative  . None    Family History: Family History  Problem Relation Age of Onset  . Diabetes Maternal Grandfather   . Heart disease Maternal Grandfather     Allergies: Allergies  Allergen Reactions  . Other Nausea And Vomiting and Other (See Comments)    Pt states that she is allergic to mayonnaise.      Prescriptions Prior to Admission  Medication Sig Dispense Refill Last Dose  . Prenatal MV-Min-FA-Omega-3 (PRENATAL GUMMIES/DHA & FA) 0.4-32.5 MG CHEW Chew 1 each by mouth 2 (two) times daily.   06/01/2016 at Unknown time  . zolpidem (AMBIEN) 5 MG tablet Take 1 tablet (5 mg total) by mouth at bedtime as needed for sleep (no more than 3 per week). (Patient not taking: Reported on 06/01/2016) 3 tablet 0 Not Taking at Unknown time     Review of Systems   All systems reviewed and negative except as stated in  HPI  Blood pressure (!) 106/53, pulse 72, temperature 98 F (36.7 C), temperature source Oral, resp. rate 16, last menstrual period 09/03/2015, SpO2 98 %, unknown if currently breastfeeding. General appearance: alert, cooperative and appears stated age Lungs: clear to auscultation bilaterally Heart: regular rate and rhythm Abdomen: soft, non-tender; bowel sounds normal Extremities: No calf swelling or tenderness Presentation: cephalic by nursing exam Fetal monitoring: category 1, 140/mod var reactive Uterine activity: category 1 Dilation: 4.5 Effacement (%): 80, 90 Station: -1, 0 Exam by:: Craige Cotta, RNC   Prenatal labs: ABO, Rh: --/--/B POS (05/07 0444) Antibody: NEG (05/07 0444) Rubella: immune RPR: Non Reactive (03/02 0901)  HBsAg: NEGATIVE (11/29 0001)  HIV: Non Reactive (03/02 0901)  GBS: Negative (04/19 0000)  3 hr Glucola: normal Genetic screening:  Quad normal Anatomy US: normal  Prenatal Transfer Tool  Maternal Diabetes: No Genetic Screening: Normal Maternal Ultrasounds/Referrals: Normal Fetal Ultrasounds or other Referrals:  None Maternal Substance Abuse:  No Significant Maternal Medications:  None Significant Maternal Lab Results: Lab values include: Group B Strep negative  Results for orders placed or performed during the hospital encounter of 06/02/16 (from the past 24 hour(s))  POCT fern test   Collection Time: 06/02/16  4:37 AM  Result Value Ref Range  POCT Fern Test Positive = ruptured amniotic membanes   CBC   Collection Time: 06/02/16  4:44 AM  Result Value Ref Range   WBC 6.3 4.0 - 10.5 K/uL   RBC 3.06 (L) 3.87 - 5.11 MIL/uL   Hemoglobin 8.7 (L) 12.0 - 15.0 g/dL   HCT 24.427.0 (L) 01.036.0 - 27.246.0 %   MCV 88.2 78.0 - 100.0 fL   MCH 28.4 26.0 - 34.0 pg   MCHC 32.2 30.0 - 36.0 g/dL   RDW 53.614.2 64.411.5 - 03.415.5 %   Platelets 228 150 - 400 K/uL  [facility] and screen [facility]   Collection Time: 06/02/16  4:44 AM  Result Value Ref Range    ABO/RH(D) B POS    Antibody Screen NEG    Sample Expiration 06/05/2016     Patient Active Problem List   Diagnosis Date Noted  . Delayed delivery after SROM (spontaneous rupture of membranes) 06/02/2016  . Insufficient social support 05/30/2016  . False labor 05/30/2016  . Supervision of low-risk pregnancy, third trimester 05/15/2016  . Preterm uterine contractions in third trimester, antepartum 04/17/2016  . Pain of round ligament affecting pregnancy, antepartum 02/06/2016  . Supervision of normal pregnancy 12/26/2015  . Choroid plexus cyst of fetus 06/07/2014  . Language barrier 03/31/2014    Assessment: Gloria Nunez is a 21 y.o. [id number] at 4267w1d here for SROM at 1 am and SOL  #Labor:expectant managment #Pain: Epidural in place #FWB: Category 1 #ID:  GBS negative #MOF: breast #MOC:NExplanon #Circ:  girl  Ernestina Pennaicholas Schenk 06/02/2016, 7:44 AM

## 2016-06-02 NOTE — Anesthesia Preprocedure Evaluation (Signed)
Anesthesia Evaluation  Patient identified by MRN, date of birth, ID band Patient awake    Reviewed: Allergy & Precautions, NPO status , Patient's Chart, lab work & pertinent test results  History of Anesthesia Complications Negative for: history of anesthetic complications  Airway Mallampati: III  TM Distance: >3 FB Neck ROM: Full    Dental  (+) Dental Advisory Given   Pulmonary neg pulmonary ROS,    breath sounds clear to auscultation       Cardiovascular negative cardio ROS   Rhythm:Regular Rate:Normal     Neuro/Psych negative neurological ROS     GI/Hepatic negative GI ROS, Neg liver ROS,   Endo/Other  negative endocrine ROS  Renal/GU negative Renal ROS     Musculoskeletal   Abdominal   Peds  Hematology  (+) Blood dyscrasia (Hb 8.7), anemia , Plt 228k   Anesthesia Other Findings   Reproductive/Obstetrics (+) Pregnancy                            Anesthesia Physical Anesthesia Plan  ASA: II  Anesthesia Plan: Epidural   Post-op Pain Management:    Induction:   Airway Management Planned: Natural Airway  Additional Equipment:   Intra-op Plan:   Post-operative Plan:   Informed Consent: I have reviewed the patients History and Physical, chart, labs and discussed the procedure including the risks, benefits and alternatives for the proposed anesthesia with the patient or authorized representative who has indicated his/her understanding and acceptance.     Plan Discussed with:   Anesthesia Plan Comments: (Patient identified. Risks/Benefits/Options discussed with patient including but not limited to bleeding, infection, nerve damage, paralysis, failed block, incomplete pain control, headache, blood pressure changes, nausea, vomiting, reactions to medication both or allergic, itching and postpartum back pain. Confirmed with bedside nurse the patient's most recent platelet count.  Confirmed with patient that they are not currently taking any anticoagulation, have any bleeding history or any family history of bleeding disorders. Patient expressed understanding and wished to proceed. All questions were answered. )        Anesthesia Quick Evaluation

## 2016-06-02 NOTE — Anesthesia Postprocedure Evaluation (Addendum)
Anesthesia Post Note  Patient: Bernerd LimboKaren Santiago-Duran  Procedure(s) Performed: * No procedures listed *  Patient location during evaluation: PACU Anesthesia Type: Spinal and Epidural Level of consciousness: oriented and awake and alert Pain management: pain level controlled Vital Signs Assessment: post-procedure vital signs reviewed and stable Respiratory status: spontaneous breathing, respiratory function stable and patient connected to nasal cannula oxygen Cardiovascular status: blood pressure returned to baseline and stable Postop Assessment: no headache and no backache Anesthetic complications: no        Last Vitals:  Vitals:   06/02/16 1535 06/02/16 1635  BP: (!) 100/39 (!) 108/56  Pulse: (!) 59 65  Resp: 18 18  Temp: 36.7 C 36.8 C    Last Pain:  Vitals:   06/02/16 1819  TempSrc:   PainSc: 7    Pain Goal: Patients Stated Pain Goal: 2 (06/02/16 0602)               Marrion CoyMERRITT,DEBRA

## 2016-06-02 NOTE — Lactation Note (Signed)
This note was copied from a baby's chart. Lactation Consultation Note  Patient Name: Gloria Nunez WUJWJ'XToday's Date: 06/02/2016 Reason for consult: Initial assessment  Initial visit at 7 hours of age. Mom reports good feedings and minimal latch on pain,  Mom reports baby is latching well with wide gape.  [facility]. Baby asleep in crib and mom reports recent feeding. [facility] resources given and discussed.  Encouraged to feed with early cues on demand.  Early newborn behavior discussed.  Hand expression demonstrated by mom with colostrum visible.  Mom to call for assist as needed.     Maternal Data Has patient been taught Hand Expression?: Yes Does the patient have breastfeeding experience prior to this delivery?: Yes  Feeding Feeding Type: Breast Fed Length of feed: 40 min  LATCH Score/Interventions Latch: Repeated attempts needed to sustain latch, nipple held in mouth throughout feeding, stimulation needed to elicit sucking reflex. Intervention(s): Adjust position  Audible Swallowing: A few with stimulation Intervention(s): Skin to skin  Type of Nipple: Everted at rest and after stimulation (right nipple short)  Comfort (Breast/Nipple): Soft / non-tender     Hold (Positioning): Assistance needed to correctly position infant at breast and maintain latch. Intervention(s): Breastfeeding basics reviewed  LATCH Score: 7  Lactation Tools Discussed/Used WIC Program: Yes   Consult Status Consult Status: Follow-up Date: 06/03/16 Follow-up type: In-patient    Gloria Nunez, Gloria Nunez 06/02/2016, 9:15 PM

## 2016-06-02 NOTE — Anesthesia Procedure Notes (Signed)
Epidural Patient location during procedure: OB Start time: 06/02/2016 5:22 AM End time: 06/02/2016 5:58 AM  Staffing Anesthesiologist: Jairo BenJACKSON, Katessa Attridge Performed: anesthesiologist   Preanesthetic Checklist Completed: patient identified, surgical consent, pre-op evaluation, timeout performed, IV checked, risks and benefits discussed and monitors and equipment checked  Epidural Patient position: sitting Prep: site prepped and draped and DuraPrep Patient monitoring: blood pressure, continuous pulse ox and heart rate Approach: midline Location: L3-L4 Injection technique: LOR air  Needle:  Needle type: Tuohy  Needle gauge: 17 G Needle length: 9 cm Needle insertion depth: 4 cm Catheter type: closed end flexible Catheter size: 19 Gauge Catheter at skin depth: 9 cm Test dose: negative (1% lidocaine)  Assessment Events: blood not aspirated, injection not painful, no injection resistance, negative IV test and no paresthesia  Additional Notes Pt identified in Labor room.  Monitors applied. Working IV access confirmed. Sterile prep, drape lumbar spine.  1% lido local L 2,3.  #17ga Touhy os, repeat local L 3,4 and  LOR air at 4 cm L 3,4, cath in easily to 9 cm skin. Test dose OK, cath dosed and infusion begun.  Patient asymptomatic, VSS, no heme aspirated, tolerated well.  Gloria Nunez Gloria Nunez, MDReason for block:procedure for pain

## 2016-06-02 NOTE — Progress Notes (Signed)
Orders received for admission.

## 2016-06-02 NOTE — Anesthesia Pain Management Evaluation Note (Signed)
  CRNA Pain Management Visit Note  Patient: Gloria Nunez, 21 y.o., female  "Hello [facility] am a [facility] the [facility]. We have an anesthesia team available at all times to provide care throughout the hospital, including epidural management and anesthesia for C-section. I don't know your plan for the delivery whether it a natural birth, water birth, IV sedation, nitrous supplementation, doula or epidural, but we want to meet your pain goals."  Patient did not desire spanish interpreter during interview.  1.Was your pain managed to your expectations on prior hospitalizations?   Yes   2.What is your expectation for pain management during this hospitalization?     Epidural  3.How can we help you reach that goal? Epidural in place.  Record the patient's initial score and the patient's pain goal.   Pain: 0  Pain Goal: 10 prior to epidural placement. [facility] wants you to be able to say your pain was always managed very well.  Gloria Nunez 06/02/2016

## 2016-06-03 ENCOUNTER — Encounter (HOSPITAL_COMMUNITY): Payer: Self-pay | Admitting: *Deleted

## 2016-06-03 MED ORDER — IBUPROFEN 600 MG PO TABS: 600.0000 mg | ORAL_TABLET | Freq: Four times a day (QID) | ORAL | 2 refills | Status: AC

## 2016-06-03 MED ORDER — FERROUS SULFATE 325 (65 FE) MG PO TABS
325.0000 mg | ORAL_TABLET | Freq: Three times a day (TID) | ORAL | Status: DC
  Administered 2016-06-03: 325 mg via ORAL
  Filled 2016-06-03: qty 1

## 2016-06-03 MED ORDER — ACETAMINOPHEN 325 MG PO TABS
650.0000 mg | ORAL_TABLET | ORAL | Status: DC | PRN
Start: 2016-06-03 — End: 2016-06-03

## 2016-06-03 MED ORDER — IBUPROFEN 600 MG PO TABS
600.0000 mg | ORAL_TABLET | Freq: Four times a day (QID) | ORAL | Status: DC
  Administered 2016-06-03: 600 mg via ORAL
  Filled 2016-06-03 (×2): qty 1

## 2016-06-03 MED ORDER — FERROUS SULFATE 325 (65 FE) MG PO TABS: 325.0000 mg | ORAL_TABLET | Freq: Two times a day (BID) | ORAL | 4 refills | Status: AC

## 2016-06-03 NOTE — Discharge Summary (Signed)
OB Discharge Summary     Patient Name: Gloria Nunez DOB: 05/01/1995 MRN: [id number]  Date of admission: 06/02/2016 Delivering MD: Lovena Neighbours   Date of discharge: 06/03/2016  Admitting diagnosis: 38 WEEKS CTX ROM Intrauterine pregnancy: [redacted]w[redacted]d     Secondary diagnosis:  Active Problems:   Delayed delivery after SROM (spontaneous rupture of membranes)  Additional problems: none     Discharge diagnosis: Term Pregnancy Delivered                                                                                                Post partum procedures:none  Augmentation: none  Complications: None  Hospital course:  Onset of Labor With Vaginal Delivery     21 y.o. yo [id number] at [redacted]w[redacted]d was admitted in Active Labor on 06/02/2016. Patient had an uncomplicated labor course as follows:  Membrane Rupture Time/Date: 1:00 AM ,06/02/2016   Intrapartum Procedures: Episiotomy: None [1]                                         Lacerations:     Patient had a delivery of a Viable infant. 06/02/2016  Information for the patient's newborn:  Eilidh, Marcano Girl Meleny [[id number]]       Pateint had an uncomplicated postpartum course.  She is ambulating, tolerating a regular diet, passing flatus, and urinating well. Patient is discharged home in stable condition on 06/03/16.   Physical exam  Vitals:   06/02/16 1535 06/02/16 1635 06/02/16 2005 06/03/16 0040  BP: (!) 100/39 (!) 108/56 (!) 98/52 (!) 100/57  Pulse: (!) 59 65 67 (!) 58  Resp: 18 18 18 18   Temp: 98.1 F (36.7 C) 98.2 F (36.8 C) 98.1 F (36.7 C) 97.9 F (36.6 C)  TempSrc: Oral Oral Oral Oral  SpO2:       General: alert, cooperative and no distress Lochia: appropriate Uterine Fundus: firm Incision: N/A DVT Evaluation: No evidence of DVT seen on physical exam. No cords or calf tenderness. No significant calf/ankle edema. Labs: Lab Results  Component Value Date   WBC 6.3 06/02/2016   HGB 8.7 (L) 06/02/2016   HCT 27.0 (L)  06/02/2016   MCV 88.2 06/02/2016   PLT 228 06/02/2016   CMP Latest Ref Rng & Units 04/13/2016  Glucose 65 - 99 mg/dL 94  BUN 6 - 20 mg/dL 14  Creatinine 1.30 - 8.65 mg/dL <7.84(O)  Sodium 962 - 145 mmol/L 134(L)  Potassium 3.5 - 5.1 mmol/L 3.1(L)  Chloride 101 - 111 mmol/L 106  CO2 22 - 32 mmol/L 20(L)  Calcium 8.9 - 10.3 mg/dL 7.3(L)  Total Protein 6.5 - 8.1 g/dL 6.3(L)  Total Bilirubin 0.3 - 1.2 mg/dL 0.8  Alkaline Phos 38 - 126 U/L 54  AST 15 - 41 U/L 16  ALT 14 - 54 U/L 11(L)    Discharge instruction: per After Visit Summary and "Baby and Me Booklet".  After visit meds:  Allergies as of 06/03/2016      Reactions   Other  Nausea And Vomiting, Other (See Comments)   Pt states that she is allergic to mayonnaise.        Medication List    TAKE these medications   ferrous sulfate 325 (65 FE) MG tablet Take 1 tablet (325 mg total) by mouth 2 (two) times daily with a meal.   ibuprofen 600 MG tablet Commonly known as:  ADVIL,MOTRIN Take 1 tablet (600 mg total) by mouth every 6 (six) hours.   prenatal multivitamin Tabs tablet Take 1 tablet by mouth daily at 12 noon.       Diet: routine diet  Activity: Advance as tolerated. Pelvic rest for 6 weeks.   Outpatient follow up:4 weeks; PP exam/nexplanon Follow up Appt:No future appointments. Follow up Visit:No Follow-up on file.  Postpartum contraception: Nexplanon  Newborn Data: Live born female  Birth Weight: 6 lb 4 oz (2835 g) APGAR: 9, 9  Baby Feeding: Breast Disposition:home with mother   06/03/2016 Roe Coombsachelle A Jamica Woodyard, CNM

## 2016-06-03 NOTE — Lactation Note (Signed)
This note was copied from a baby's chart. Lactation Consultation Note  Patient Name: Gloria Nunez ZOXWR'UToday's Date: 06/03/2016 Reason for consult: Follow-up assessment  Baby 20 hours old. Experience BF mom has baby at breast when this LC entered the room. Mom reports that she is able to see colostrum when she hand expresses. Baby latched deeply to right breast in cradle position and mom denies any discomfort. Baby has her lips flanged and is suckling rhythmically with a few swallows noted. Baby has the hiccups, and mom has to re-latch the baby a couple of times while this LC in the room. But baby eagerly re-latches deeply. Mom aware of OP/BFSG and LC phone line assistance after D/C.   Maternal Data    Feeding Feeding Type: Breast Fed Length of feed: 10 min  LATCH Score/Interventions Latch: Repeated attempts needed to sustain latch, nipple held in mouth throughout feeding, stimulation needed to elicit sucking reflex. (Baby had hiccups)  Audible Swallowing: A few with stimulation Intervention(s): Hand expression  Type of Nipple: Everted at rest and after stimulation  Comfort (Breast/Nipple): Soft / non-tender     Hold (Positioning): No assistance needed to correctly position infant at breast.  LATCH Score: 8  Lactation Tools Discussed/Used     Consult Status Consult Status: Follow-up Date: 06/04/16 Follow-up type: In-patient    Sherlyn HayJennifer D [facility] Teegarden 06/03/2016, 10:00 AM

## 2016-06-03 NOTE — Progress Notes (Signed)
UR chart review completed.  

## 2016-06-03 NOTE — Progress Notes (Signed)
Post Partum Day #1 Subjective: no complaints, up ad lib, voiding, tolerating PO and breast feeding is going well.  Objective: Blood pressure (!) 100/57, pulse (!) 58, temperature 97.9 F (36.6 C), temperature source Oral, resp. rate 18, last menstrual period 09/03/2015, SpO2 98 %, unknown if currently breastfeeding.  Physical Exam:  General: alert, cooperative and no distress Lochia: appropriate Uterine Fundus: firm Incision: none DVT Evaluation: No evidence of DVT seen on physical exam. No cords or calf tenderness. No significant calf/ankle edema.   Recent Labs  06/02/16 0444  HGB 8.7*  HCT 27.0*    Assessment/Plan: Discharge home, Breastfeeding and Contraception Nexplanon in office PP. Anemia: asymptomatic, iron started.    LOS: 1 day   Gloria Nunez, CNM 06/03/2016, 7:53 AM

## 2016-06-03 NOTE — Clinical Social Work Maternal (Signed)
  CLINICAL SOCIAL WORK MATERNAL/CHILD NOTE  Patient Details  Name: Gloria Nunez MRN: [id number] Date of Birth: Jun 30, 1995  Date:  06/03/2016  Clinical Social Worker Initiating Note:  Laurey Arrow Date/ Time Initiated:  06/03/16/1157     Child's Name:  Arletha Pili   Legal Guardian:  Mother (FOB is Neal Dy)   Need for Interpreter:  None   Date of Referral:  06/03/16     Reason for Referral:  Other (Comment) (insuffient social support)   Referral Source:  [facility]   Address:  [address] [place]  Phone number:  [phone]   Household Members:  Self, Minor Children, Spouse (Pasty Spillers 4 yrs old and Trenda Moots 23 yrs old. )   Natural Supports (not living in the home):  Immediate Family, Friends   Chiropodist: None   Employment: Unemployed   Type of Work:     Education:  9 to 11 years   Pensions consultant:  [employer]   Other Resources:  [employer], Physicist, medical    Cultural/Religious Considerations Which May Impact Care:  None Reported  Strengths:  Ability to meet basic needs , Engineer, materials , Home prepared for child    Risk Factors/Current Problems:  None   Cognitive State:  Alert , Able to Concentrate , Linear Thinking , Insightful    Mood/Affect:  Bright , Happy , Interested , Comfortable    CSW Assessment: CSW met with MOB to complete an assessment for insufficient social support.  When CSW arrived, MOB was in the bed resting and infant was asleep in the bassinet. MOB was soften spoken, polite, and easy to engage.  CSW offered MOB interpreting services and MOB declined.   CSW inquired about MOB's support and MOB reported being supported by FOB/Husband and MOB's family.  MOB communicated that MOB's father is a huge source of support as well as MOB's and FOB's local friends.  CSW offered MOB community resources for parenting education and child development education and MOB declined.  MOB  stated that MOB feels to prepared to care for infant and the family has all necessary items.  CSW provided MOB with information to add infant to [place] and Medicaid applications; MOB was appreciative. MOB denied SI and HI.  MOB also denied MH, SA, and DV hx.  CSW thanked MOB for meeting with CSW and provided MOB with CSW's contact information.   CSW Plan/Description:  Information/Referral to [employer] , [employer] , No Further Intervention Required/No Barriers to Discharge   Laurey Arrow, MSW, LCSW Clinical Social Work [phone]  Dimple Nanas, LCSW 06/03/2016, 12:00 PM

## 2016-06-04 ENCOUNTER — Encounter: Payer: Self-pay | Admitting: General Practice

## 2016-07-15 ENCOUNTER — Encounter: Payer: Self-pay | Admitting: Obstetrics and Gynecology

## 2016-07-15 ENCOUNTER — Ambulatory Visit (INDEPENDENT_AMBULATORY_CARE_PROVIDER_SITE_OTHER): Payer: Medicaid Other | Admitting: Obstetrics and Gynecology

## 2016-07-15 DIAGNOSIS — Z3049 Encounter for surveillance of other contraceptives: Secondary | ICD-10-CM | POA: Diagnosis not present

## 2016-07-15 DIAGNOSIS — Z30017 Encounter for initial prescription of implantable subdermal contraceptive: Secondary | ICD-10-CM

## 2016-07-15 DIAGNOSIS — Z3202 Encounter for pregnancy test, result negative: Secondary | ICD-10-CM | POA: Diagnosis not present

## 2016-07-15 LAB — POCT PREGNANCY, URINE: Preg Test, Ur: NEGATIVE

## 2016-07-15 MED ORDER — ETONOGESTREL 68 MG ~~LOC~~ IMPL
68.0000 mg | DRUG_IMPLANT | Freq: Once | SUBCUTANEOUS | Status: AC
  Administered 2016-07-15: 68 mg via SUBCUTANEOUS

## 2016-07-15 NOTE — Progress Notes (Signed)
Subjective:     Gloria Nunez is a 21 y.o. female who presents for a postpartum visit. She is 6 weeks postpartum following a spontaneous vaginal delivery. I have fully reviewed the prenatal and intrapartum course. The delivery was at 38.1 gestational weeks. Outcome: spontaneous vaginal delivery. Anesthesia: epidural. Postpartum course has been uncomplicated.  Baby's course has been uncomplicated. Baby is feeding by breast. Bleeding thin lochia. Bowel function is normal. Bladder function is normal. Patient is sexually active. Contraception method is none. Postpartum depression screening: negative.  The following portions of the patient's history were reviewed and updated as appropriate: allergies, current medications, past family history, past medical history, past social history, past surgical history and problem list.  Review of Systems Pertinent items are noted in HPI.   Objective:    BP 113/72   Pulse 79   Wt 103 lb 3.2 oz (46.8 kg)   LMP 07/13/2016 (Exact Date)   Breastfeeding? Yes   BMI 17.17 kg/m    General:  alert, cooperative and appears stated age   Breasts:  inspection negative, no nipple discharge or bleeding, no masses or nodularity palpable  Lungs: clear to auscultation bilaterally  Heart:  regular rate and rhythm, S1, S2 normal, no murmur, click, rub or gallop  Abdomen: soft, non-tender; bowel sounds normal; no masses,  no organomegaly   Vulva:  not evaluated  Vagina: not evaluated  Cervix:  not evaluated   Corpus: not examined  Adnexa:  not evaluated  Rectal Exam: Not performed.        Assessment:   Normal  postpartum exam. Pap smear not done at today's visit. Last pap was 2017 and it was normal.   Plan:   1. Contraception: Nexplanon 2. Back up contraception X 7 days  3. Follow up as needed.              GYNECOLOGY OFFICE PROCEDURE NOTE  Gloria Nunez is a 21 y.o. [phone] here for Nexplanon insertion.  Last pap smear was in 2017 and  was normal.  No other gynecologic concerns.  Nexplanon Insertion Procedure Patient identified, informed consent performed, consent signed.   Patient does understand that irregular bleeding is a very common side effect of this medication. She was advised to have backup contraception for one week after placement. Pregnancy test in clinic today was negative.  Appropriate time out taken.  Patient's left arm was prepped and draped in the usual sterile fashion. The ruler used to measure and mark insertion area.  Patient was prepped with alcohol swab and then injected with 3 ml of 1% lidocaine.  She was prepped with betadine, Nexplanon removed from packaging,  Device confirmed in needle, then inserted full length of needle and withdrawn per handbook instructions. Nexplanon was able to palpated in the patient's arm; patient palpated the insert herself. There was minimal blood loss.  Patient insertion site covered with guaze and a pressure bandage to reduce any bruising.  The patient tolerated the procedure well and was given post procedure instructions.     Rasch, Harolyn RutherfordJennifer I, NP [facility]

## 2016-08-14 NOTE — Addendum Note (Signed)
Addendum  created 08/14/16 1812 by Jairo BenJackson, Neko Mcgeehan, MD   Sign clinical note

## 2016-09-12 IMAGING — US US OB COMP LESS 14 WK
1 series · 14 of 28 positions shown · non-contrast
Comparison: None.

CLINICAL DATA: Right lower quadrant pain since 03/04/2014.
Estimated gestational age by LMP is 11 weeks 1 day. Quantitative
beta HCG was not ordered.

EXAM:
OBSTETRIC <14 WK ULTRASOUND
TECHNIQUE: Transabdominal ultrasound was performed for evaluation of the
gestation as well as the maternal uterus and adnexal regions.

[Series 1: us ob comp less 14 wk · 0.21mm/px · 14 of 29 slices shown]
[im 2/29]
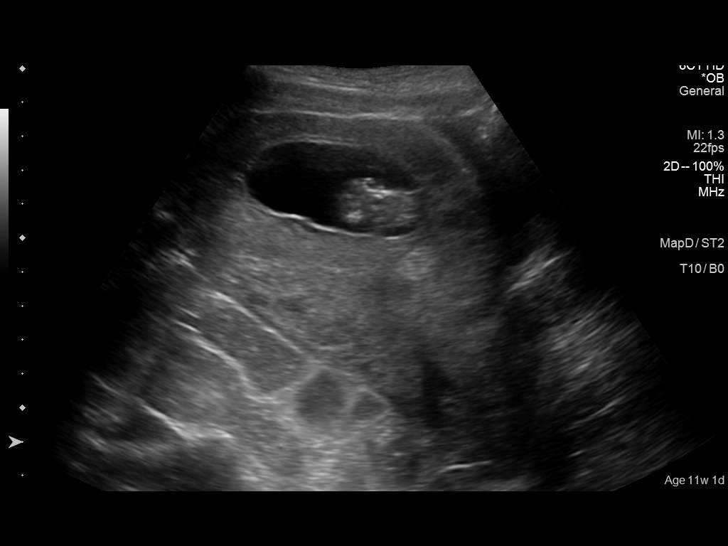
[im 4/29]
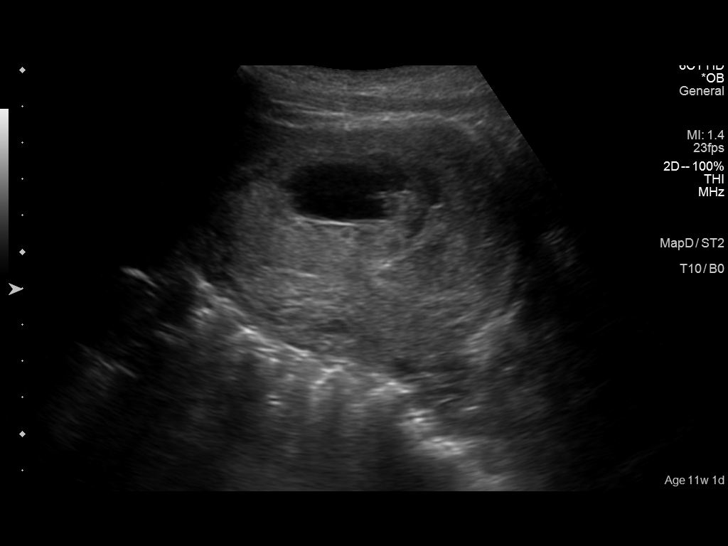
[im 6/29]
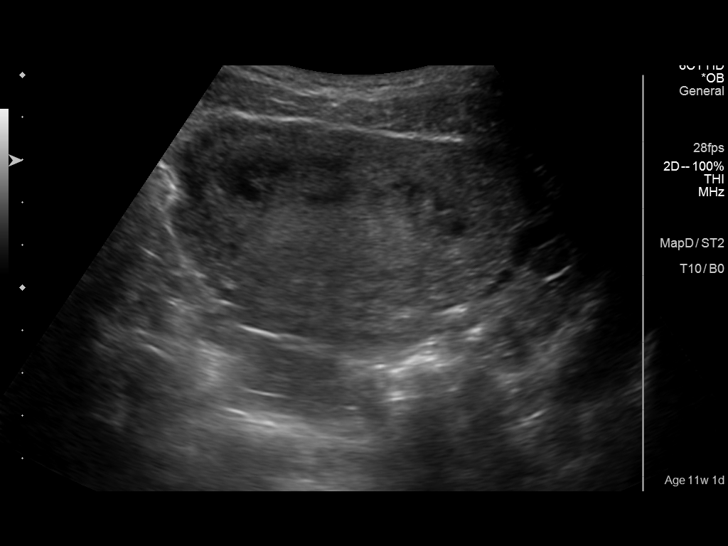
[im 8/29]
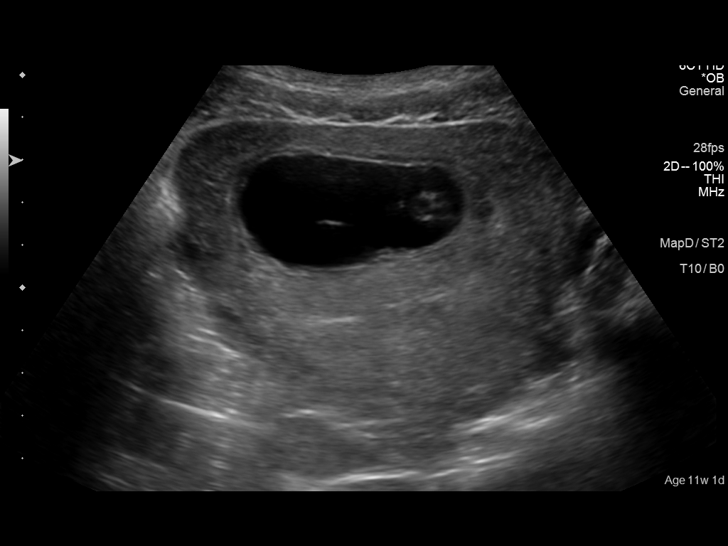
[im 10/29]
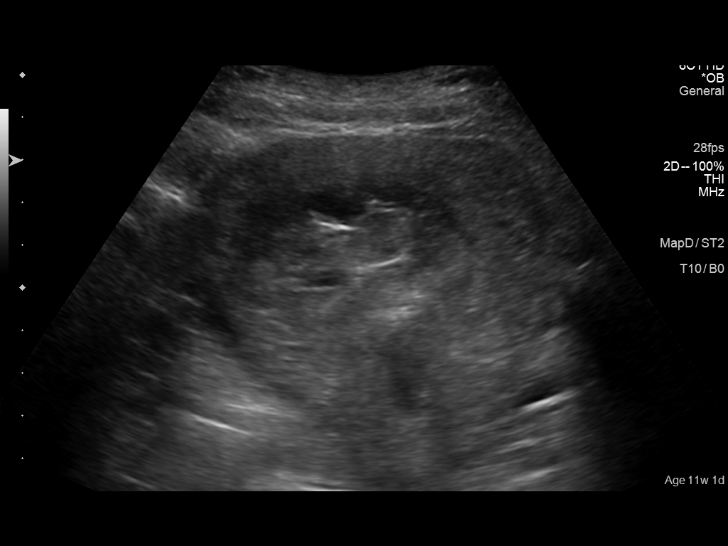
[im 12/29]
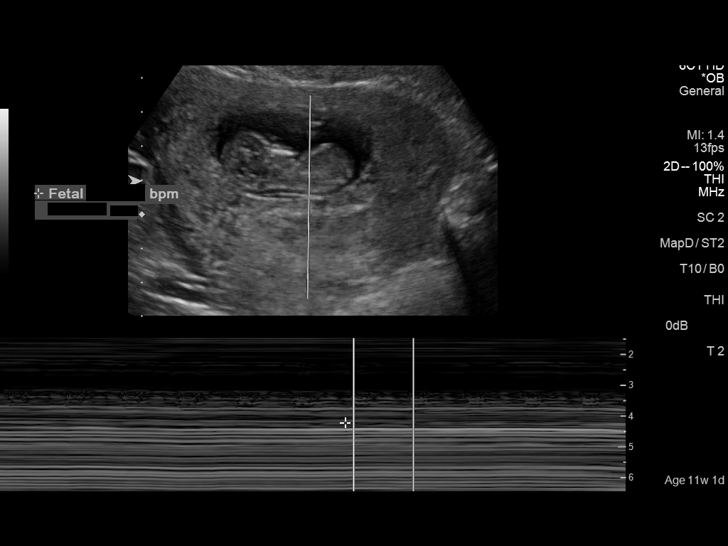
[im 14/29]
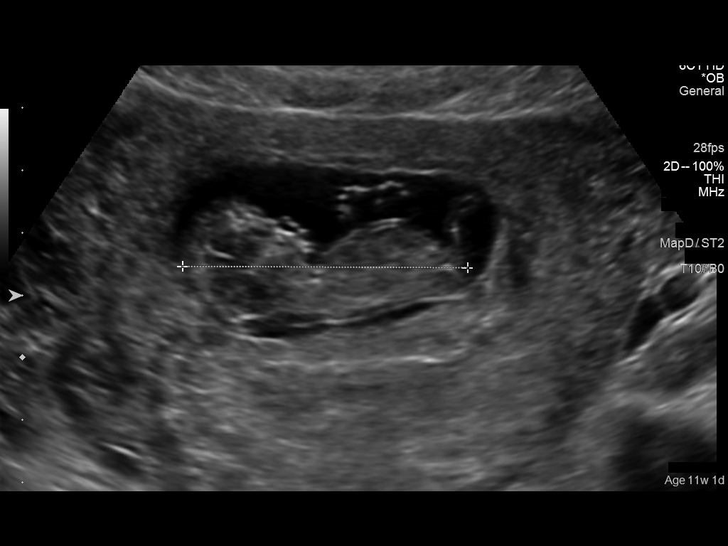
[im 16/29]
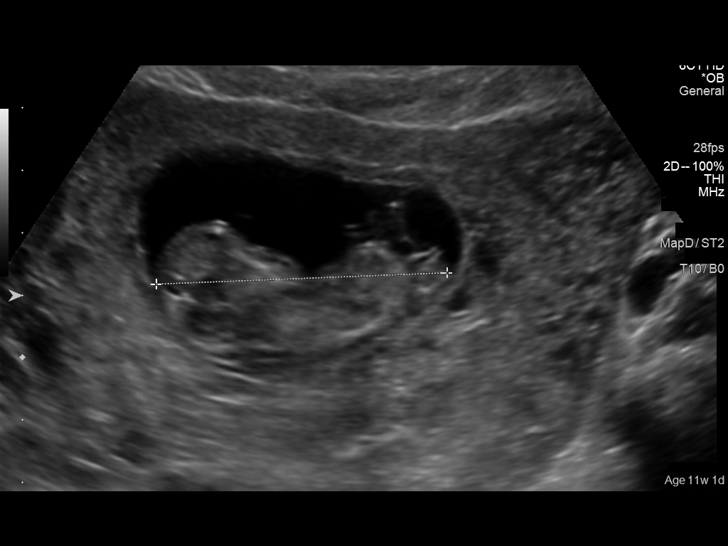
[im 18/29]
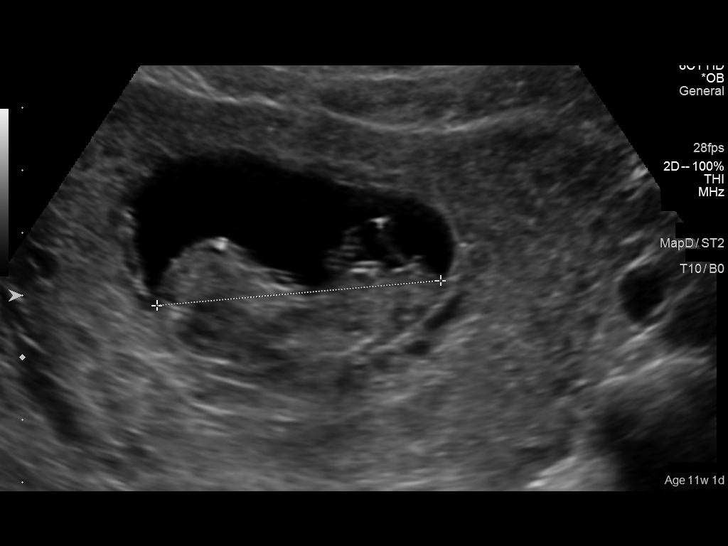
[im 20/29]
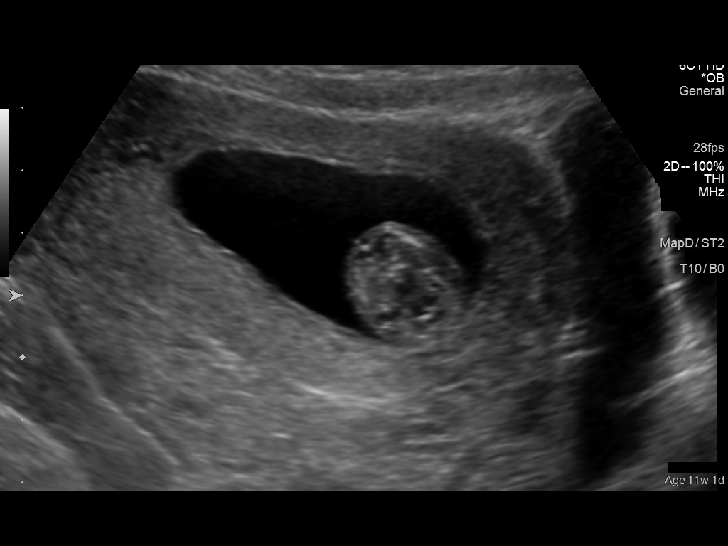
[im 22/29]
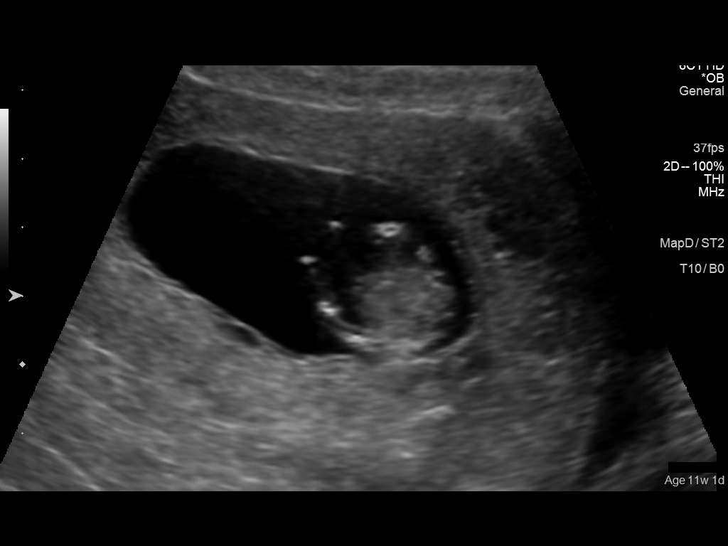
[im 24/29]
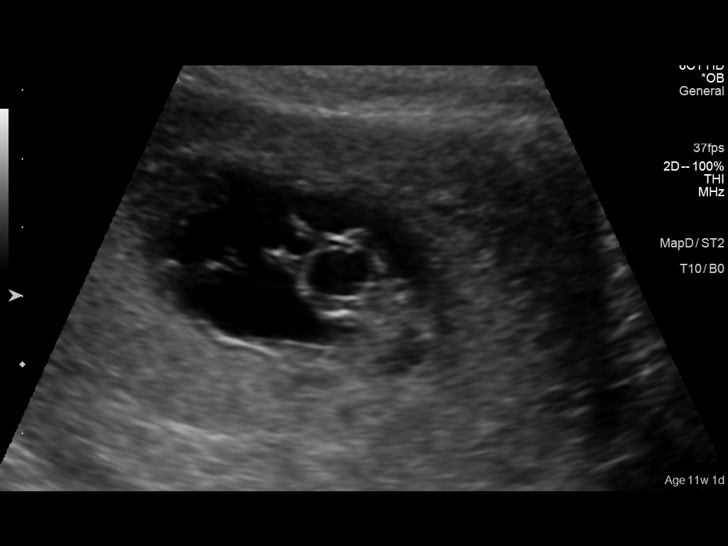
[im 26/29]
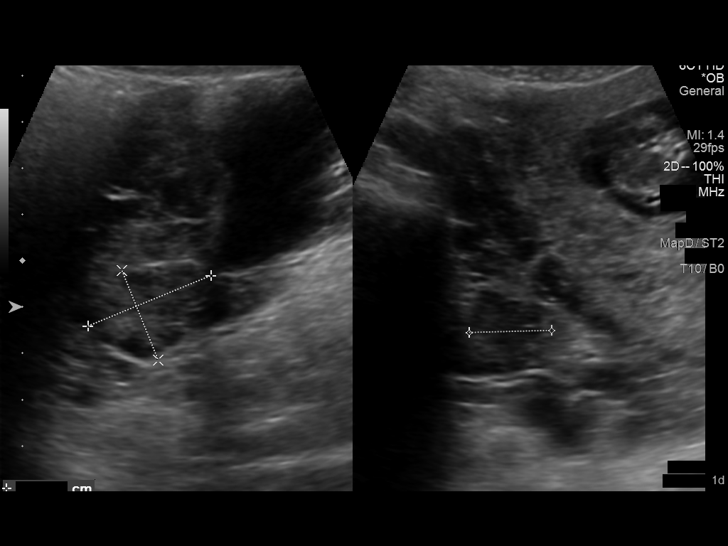
[im 29/29]
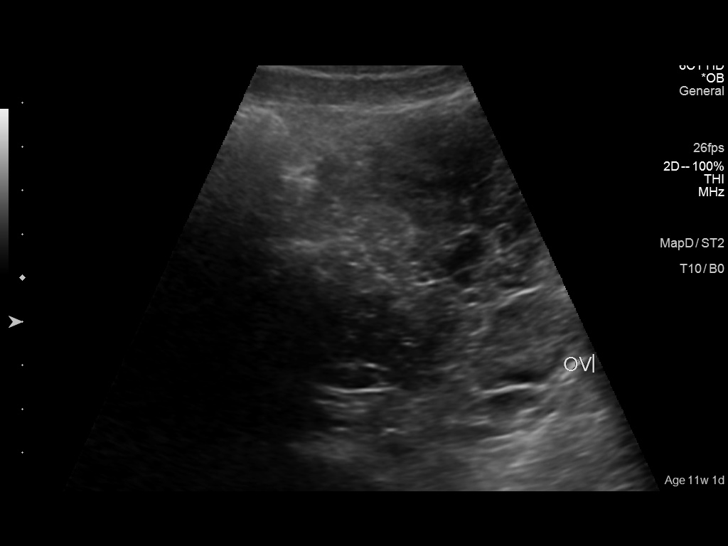

[14 of 28 positions shown; findings below may reference images not displayed]

FINDINGS: Intrauterine gestational sac: A single intrauterine pregnancy is
identified.

Yolk sac:  Yolk sac is not present, consistent with gestational age.

Embryo:  Fetal pole is identified.

Cardiac Activity: Fetal cardiac activity is observed.

Heart Rate: 171 bpm

CRL:   46  mm   11 w 3 d                  US EDC: 09/21/2014

Maternal uterus/adnexae: No myometrial mass lesions. No subchorionic
hemorrhage. Both ovaries are identified and appear normal. No free
pelvic fluid collections.
IMPRESSION: Single living intrauterine pregnancy. Estimated gestational age by
crown-rump length is 11 weeks 3 days. No acute complication is
suggested.

## 2017-03-26 ENCOUNTER — Encounter: Payer: Self-pay | Admitting: *Deleted

## 2017-06-02 ENCOUNTER — Encounter: Payer: Self-pay | Admitting: *Deleted

## 2017-08-14 ENCOUNTER — Ambulatory Visit (INDEPENDENT_AMBULATORY_CARE_PROVIDER_SITE_OTHER): Payer: Medicaid Other | Admitting: Advanced Practice Midwife

## 2017-08-14 ENCOUNTER — Other Ambulatory Visit (HOSPITAL_COMMUNITY)
Admission: RE | Admit: 2017-08-14 | Discharge: 2017-08-14 | Disposition: A | Discharge status: Home / Self Care | Payer: Medicaid Other | Source: Ambulatory Visit | Attending: Advanced Practice Midwife | Admitting: Advanced Practice Midwife

## 2017-08-14 ENCOUNTER — Encounter: Payer: Self-pay | Admitting: Advanced Practice Midwife

## 2017-08-14 VITALS — BP 98/64 | HR 71 | Ht 65.0 in | Wt 92.0 lb

## 2017-08-14 DIAGNOSIS — Z0000 Encounter for general adult medical examination without abnormal findings: Secondary | ICD-10-CM

## 2017-08-14 DIAGNOSIS — Z23 Encounter for immunization: Secondary | ICD-10-CM

## 2017-08-14 DIAGNOSIS — N921 Excessive and frequent menstruation with irregular cycle: Secondary | ICD-10-CM

## 2017-08-14 DIAGNOSIS — Z114 Encounter for screening for human immunodeficiency virus [HIV]: Secondary | ICD-10-CM

## 2017-08-14 DIAGNOSIS — Z124 Encounter for screening for malignant neoplasm of cervix: Secondary | ICD-10-CM | POA: Insufficient documentation

## 2017-08-14 DIAGNOSIS — R5381 Other malaise: Secondary | ICD-10-CM

## 2017-08-14 DIAGNOSIS — Z975 Presence of (intrauterine) contraceptive device: Secondary | ICD-10-CM

## 2017-08-14 DIAGNOSIS — Z01419 Encounter for gynecological examination (general) (routine) without abnormal findings: Secondary | ICD-10-CM

## 2017-08-14 DIAGNOSIS — R0602 Shortness of breath: Secondary | ICD-10-CM

## 2017-08-14 DIAGNOSIS — Z202 Contact with and (suspected) exposure to infections with a predominantly sexual mode of transmission: Secondary | ICD-10-CM

## 2017-08-14 DIAGNOSIS — R232 Flushing: Secondary | ICD-10-CM

## 2017-08-14 MED ORDER — NORGESTIMATE-ETH ESTRADIOL 0.25-35 MG-MCG PO TABS: 1.0000 | ORAL_TABLET | Freq: Every day | ORAL | 0 refills | Status: AC

## 2017-08-14 NOTE — Progress Notes (Signed)
Pt states that she has nexplanon and didn't;t have a period for a year but has now had a period for two week straight  Last pap 12/26/15- normal results  Pt desires STD testing

## 2017-08-14 NOTE — Progress Notes (Signed)
Subjective:     Gloria LimboKaren Nunez is a 22 y.o. female here for a routine exam, STD testing and Pap.  Current complaints: Light bleeding x 2 weeks, episodes of feeling hot, dizzy, SOB x ~9 months.  No SOB now. Improves w/ getting fresh air. Sx more common when taking care of her three young children. No chest pain, diaphoresis, palpitations, cough, fever, chills, syncope. No weight changes, hair loss. Has normal appetite and eats three meals per day.   Nexplanon in place x 1 year.    Gynecologic History Patient's last menstrual period was 08/07/2017. Contraception: Nexplanon Last Pap: 2017. Results were: normal Last mammogram: None. Results were: NA  Obstetric History OB History  Gravida Para Term Preterm AB Living  4 3 3   1 3   SAB TAB Ectopic Multiple Live Births  1     0 3    # Outcome Date GA Lbr Len/2nd Weight Sex Delivery Anes PTL Lv  4 Term 06/02/16 4472w1d 11:14 / 00:47 6 lb 4 oz (2.835 kg) F Vag-Spont EPI  LIV     Birth Comments: no  3 Term 09/09/14 6378w0d 12:52 / 00:13 6 lb 10.2 oz (3.011 kg) M Vag-Spont None  LIV  2 Term 12/03/12 461w0d   M Vag-Spont   LIV  1 SAB  4359w0d            The following portions of the patient's history were reviewed and updated as appropriate: allergies, current medications, past family history, past medical history, past social history, past surgical history and problem list.  Review of Systems Pertinent items noted in HPI and remainder of comprehensive ROS otherwise negative.    Objective:    BP 98/64   Pulse 71   Ht 5\' 5"  (1.651 m)   Wt 92 lb (41.7 kg)   LMP 08/07/2017   Breastfeeding? No   BMI 15.31 kg/m   General Appearance:    Alert, cooperative, no distress, appears stated age  Head:    Normocephalic, without obvious abnormality, atraumatic  Eyes:    PERRL, conjunctiva/corneas clear, EOM's intact, fundi    benign, both eyes  Ears:    Normal TM's and external ear canals, both ears  Nose:   Nares normal, septum midline, mucosa  normal, no drainage    or sinus tenderness  Throat:   Lips, mucosa, and tongue normal; teeth and gums normal  Neck:   Supple, symmetrical, trachea midline, no adenopathy;    thyroid:  no enlargement/tenderness/nodules; no carotid   bruit or JVD  Back:     Symmetric, no curvature, ROM normal, no CVA tenderness  Lungs:     Clear to auscultation bilaterally, respirations unlabored  Chest Wall:    No tenderness or deformity   Heart:    Regular rate and rhythm, S1 and S2 normal, no murmur, rub   or gallop  Breast Exam:    No tenderness, masses, or nipple abnormality  Abdomen:     Soft, non-tender, bowel sounds active all four quadrants,    no masses, no organomegaly  Genitalia:    Normal female without lesion, discharge or tenderness  Rectal:    Normal tone, normal prostate, no masses or tenderness;   guaiac negative stool  Extremities:   Extremities normal, atraumatic, no cyanosis or edema  Pulses:   2+ and symmetric all extremities  Skin:   Skin color, texture, turgor normal, no rashes or lesions  Lymph nodes:   Cervical, supraclavicular, and axillary nodes normal  Neurologic:  CNII-XII intact, normal strength, sensation and reflexes    throughout      Assessment:    Healthy female exam.   1. Well woman exam  - etonogestrel (NEXPLANON) 68 MG IMPL implant; 1 each by Subdermal route once. - Cytology - PAP - TSH - CBC - HIV antibody (with reflex) - RPR - Hepatitis C Antibody - Hepatitis B Surface AntiGEN - HPV vaccine quadravalent 3 dose IM  2. Possible exposure to STD  - HIV antibody (with reflex) - RPR - Hepatitis C Antibody - Hepatitis B Surface AntiGEN  3. Malaise  - TSH - CBC  4. Hot flashes   5. SOB (shortness of breath). Not present during visit. No evidence of emergent condition.   - F/U w/ PCP  - ED for emergent Sx - TSH - CBC  6. Pap smear for cervical cancer screening  - Cytology - PAP  7. Encounter for screening for HIV  - HIV antibody (with  reflex)  8. Need for HPV vaccination  - HPV vaccine quadravalent 3 dose IM  9. Breakthrough bleeding on Nexplanon - offered OCP - Discussed that this is a common side effect. Pt states it is manageable and she does not desire change of contraception at this time.    Plan:    Education reviewed: safe sex/STD prevention and Start Gardsil series. Discussed HPV cause of cervical carcer and genital warts. . Contraception: Nexplanon. Follow up in: 1 month.    PCP contact info given  Katrinka Blazing, [place], CNM 08/14/2017 10:00 AM

## 2017-08-14 NOTE — Patient Instructions (Signed)
Safe Sex Practicing safe sex means taking steps before and during sex to reduce your risk of:  Getting an STD (sexually transmitted disease).  Giving your partner an STD.  Unwanted pregnancy.  How can I practice safe sex?  To practice safe sex:  Limit your sexual partners to only one partner who is having sex with only you.  Avoid using alcohol and recreational drugs before having sex. These substances can affect your judgment.  Before having sex with a new partner: ? Talk to your partner about past partners, past STDs, and drug use. ? You and your partner should be screened for STDs and discuss the results with each other.  Check your body regularly for sores, blisters, rashes, or unusual discharge. If you notice any of these problems, visit your health care provider.  If you have symptoms of an infection or you are being treated for an STD, avoid sexual contact.  While having sex, use a condom. Make sure to: ? Use a condom every time you have vaginal, oral, or anal sex. Both females and males should wear condoms during oral sex. ? Keep condoms in place from the beginning to the end of sexual activity. ? Use a latex condom, if possible. Latex condoms offer the best protection. ? Use only water-based lubricants or oils to lubricate a condom. Using petroleum-based lubricants or oils will weaken the condom and increase the chance that it will break.  See your health care provider for regular screenings, exams, and tests for STDs.  Talk with your health care provider about the form of birth control (contraception) that is best for you.  Get vaccinated against hepatitis B and human papillomavirus (HPV).  If you are at risk of being infected with HIV (human immunodeficiency virus), talk with your health care provider about taking a prescription medicine to prevent HIV infection. You are considered at risk for HIV if: ? You are a man who has sex with other men. ? You are a  heterosexual man or woman who is sexually active with more than one partner. ? You take drugs by injection. ? You are sexually active with a partner who has HIV.  This information is not intended to replace advice given to you by your health care provider. Make sure you discuss any questions you have with your health care provider. Document Released: 02/21/2004 Document Revised: 05/30/2015 Document Reviewed: 12/03/2014 Elsevier Interactive Patient Education  2018 [employer] Human Papillomavirus Quadrivalent Vaccine suspension for injection What is this medicine? HUMAN PAPILLOMAVIRUS VACCINE (HYOO muhn pap uh LOH muh vahy ruhs vak SEEN) is a vaccine. It is used to prevent infections of four types of the human papillomavirus. In women, the vaccine may lower your risk of getting cervical, vaginal, vulvar, or anal cancer and genital warts. In men, the vaccine may lower your risk of getting genital warts and anal cancer. You cannot get these diseases from the vaccine. This vaccine does not treat these diseases. This medicine may be used for other purposes; ask your health care provider or pharmacist if you have questions. COMMON BRAND NAME(S): Gardasil What should I tell my health care provider before I take this medicine? They need to know if you have any of these conditions: -fever or infection -hemophilia -HIV infection or AIDS -immune system problems -low platelet count -an unusual reaction to Human Papillomavirus Vaccine, yeast, other medicines, foods, dyes, or preservatives -pregnant or trying to get pregnant -breast-feeding How should I use this medicine? This vaccine is for   injection in a muscle on your upper arm or thigh. It is given by a health care professional. You will be observed for 15 minutes after each dose. Sometimes, fainting happens after the vaccine is given. You may be asked to sit or lie down during the 15 minutes. Three doses are given. The second dose is given 2 months  after the first dose. The last dose is given 4 months after the second dose. A copy of a Vaccine Information Statement will be given before each vaccination. Read this sheet carefully each time. The sheet may change frequently. Talk to your pediatrician regarding the use of this medicine in children. While this drug may be prescribed for children as young as 9 years of age for selected conditions, precautions do apply. Overdosage: If you think you have taken too much of this medicine contact a poison control center or emergency room at once. NOTE: This medicine is only for you. Do not share this medicine with others. What if I miss a dose? All 3 doses of the vaccine should be given within 6 months. Remember to keep appointments for follow-up doses. Your health care provider will tell you when to return for the next vaccine. Ask your health care professional for advice if you are unable to keep an appointment or miss a scheduled dose. What may interact with this medicine? -other vaccines This list may not describe all possible interactions. Give your health care provider a list of all the medicines, herbs, non-prescription drugs, or dietary supplements you use. Also tell them if you smoke, drink alcohol, or use illegal drugs. Some items may interact with your medicine. What should I watch for while using this medicine? This vaccine may not fully protect everyone. Continue to have regular pelvic exams and cervical or anal cancer screenings as directed by your doctor. The Human Papillomavirus is a sexually transmitted disease. It can be passed by any kind of sexual activity that involves genital contact. The vaccine works best when given before you have any contact with the virus. Many people who have the virus do not have any signs or symptoms. Tell your doctor or health care professional if you have any reaction or unusual symptom after getting the vaccine. What side effects may I notice from receiving  this medicine? Side effects that you should report to your doctor or health care professional as soon as possible: -allergic reactions like skin rash, itching or hives, swelling of the face, lips, or tongue -breathing problems -feeling faint or lightheaded, falls Side effects that usually do not require medical attention (report to your doctor or health care professional if they continue or are bothersome): -cough -dizziness -fever -headache -nausea -redness, warmth, swelling, pain, or itching at site where injected This list may not describe all possible side effects. Call your doctor for medical advice about side effects. You may report side effects to FDA at 1-800-FDA-1088. Where should I keep my medicine? This drug is given in a hospital or clinic and will not be stored at home. NOTE: This sheet is a summary. It may not cover all possible information. If you have questions about this medicine, talk to your doctor, pharmacist, or health care provider.  2018 Elsevier/Gold Standard (2013-03-07 13:14:33)  

## 2017-08-17 ENCOUNTER — Telehealth: Payer: Self-pay | Admitting: *Deleted

## 2017-08-17 LAB — RPR: RPR Ser Ql: NONREACTIVE

## 2017-08-17 LAB — HIV ANTIBODY (ROUTINE TESTING W REFLEX): HIV 1&2 Ab, 4th Generation: NONREACTIVE

## 2017-08-17 LAB — CYTOLOGY - PAP
Chlamydia: NEGATIVE
DIAGNOSIS: NEGATIVE
Neisseria Gonorrhea: NEGATIVE

## 2017-08-17 LAB — HEPATITIS C ANTIBODY
HEP C AB: NONREACTIVE
SIGNAL TO CUT-OFF: 0.78 (ref <1.00)

## 2017-08-17 LAB — CBC
HEMATOCRIT: 37.1 % (ref 35.0–45.0)
HEMOGLOBIN: 12.1 g/dL (ref 11.7–15.5)
MCH: 30.6 pg (ref 27.0–33.0)
MCHC: 32.6 g/dL (ref 32.0–36.0)
MCV: 93.7 fL (ref 80.0–100.0)
MPV: 11.5 fL (ref 7.5–12.5)
Platelets: 192 10*3/uL (ref 140–400)
RBC: 3.96 10*6/uL (ref 3.80–5.10)
RDW: 11.4 % (ref 11.0–15.0)
WBC: 4 10*3/uL (ref 3.8–10.8)

## 2017-08-17 LAB — HEPATITIS B SURFACE ANTIGEN: Hepatitis B Surface Ag: NONREACTIVE

## 2017-08-17 LAB — TSH: TSH: 2.17 m[IU]/L

## 2017-08-17 NOTE — Telephone Encounter (Signed)
LM on voicemail of normal labs and that she should f/u with her PCP for the dizziness.

## 2017-08-17 NOTE — Telephone Encounter (Signed)
-----   Message from Virginia Smith, [place] sent at 08/17/2017  5:18 AM EDT ----- TSH, H&H are Nml. Please encourage pt to establish care w/ PCP for further eval of dizziness, SOB.

## 2017-10-15 ENCOUNTER — Ambulatory Visit: Payer: Medicaid Other

## 2017-10-15 ENCOUNTER — Telehealth: Payer: Self-pay | Admitting: *Deleted

## 2017-10-15 NOTE — Telephone Encounter (Signed)
Left patient a message to call and reschedule appointment missed on 10/15/17 at 8:30am.

## 2017-11-23 ENCOUNTER — Encounter: Payer: Self-pay | Admitting: *Deleted

## 2018-09-25 IMAGING — US US MFM OB FOLLOW-UP
1 series · 14 of 28 positions shown · non-contrast
Comparison: none

[Series 1: us mfm ob follow-up · 59 acquisitions, 14 frames shown]
[im 3/59]
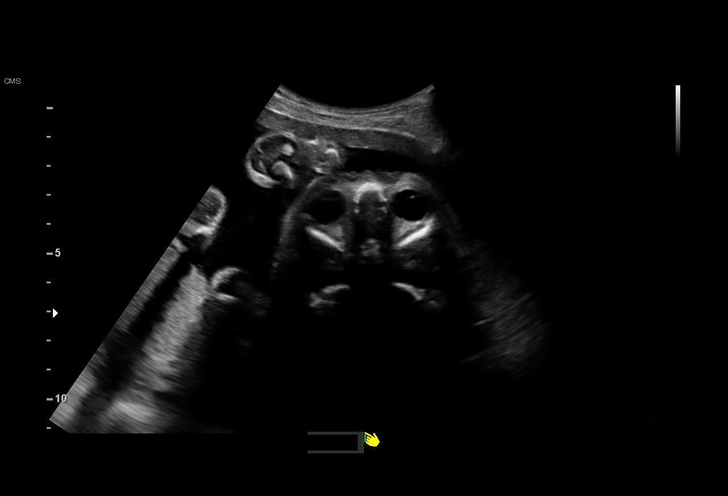
[im 7/59]
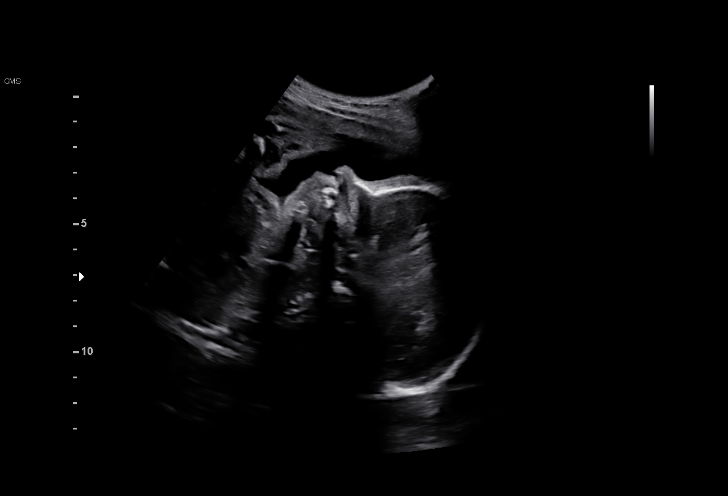
[im 11/59]
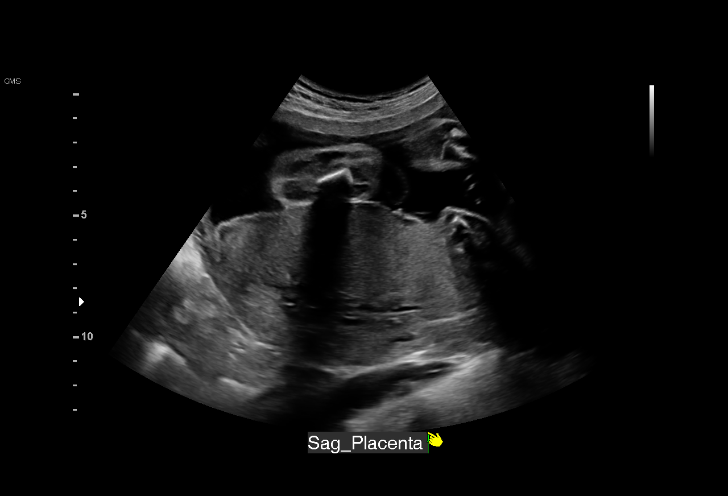
[im 16/59]
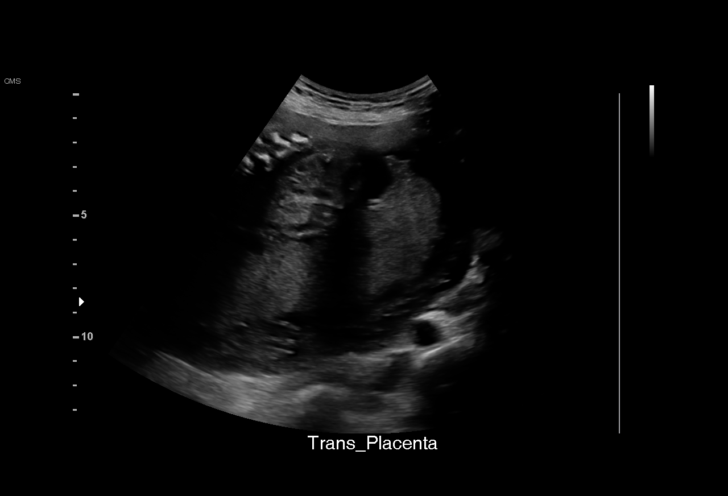
[im 20/59]
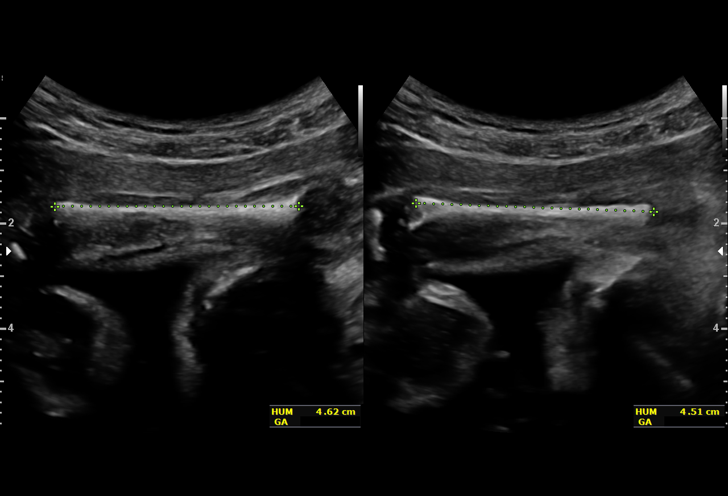
[im 24/59]
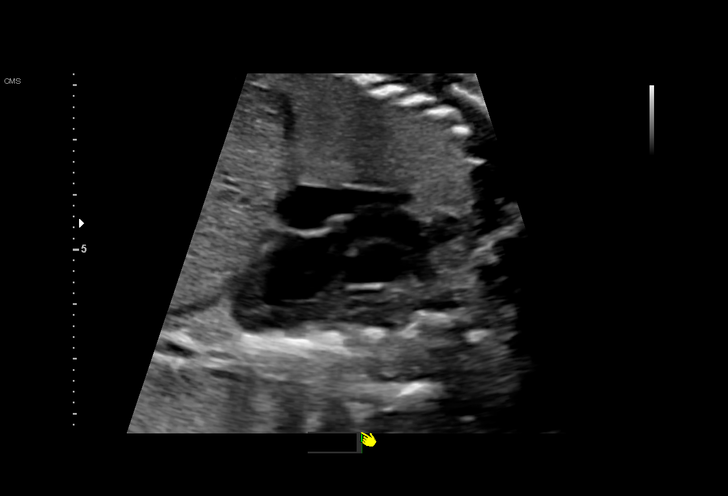
[im 28/59]
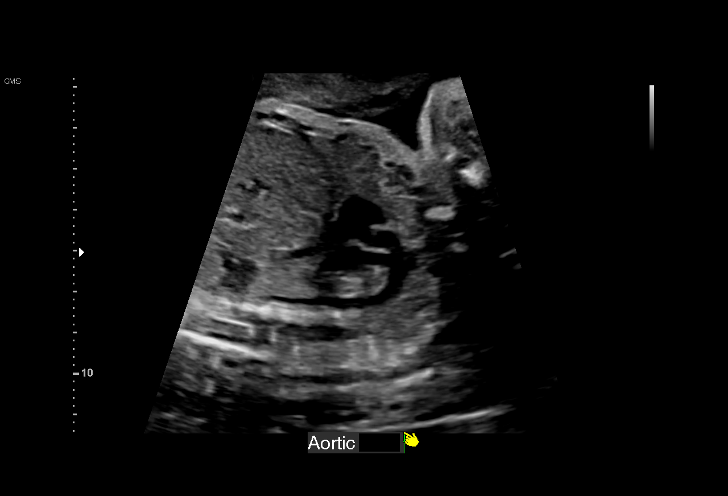
[im 33/59]
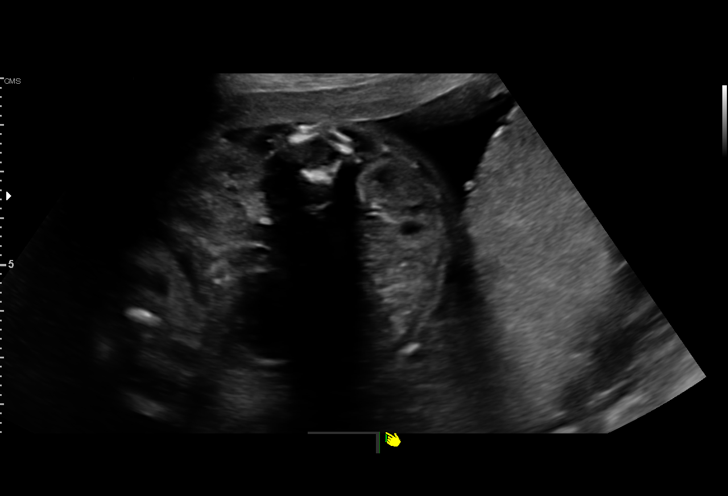
[im 37/59]
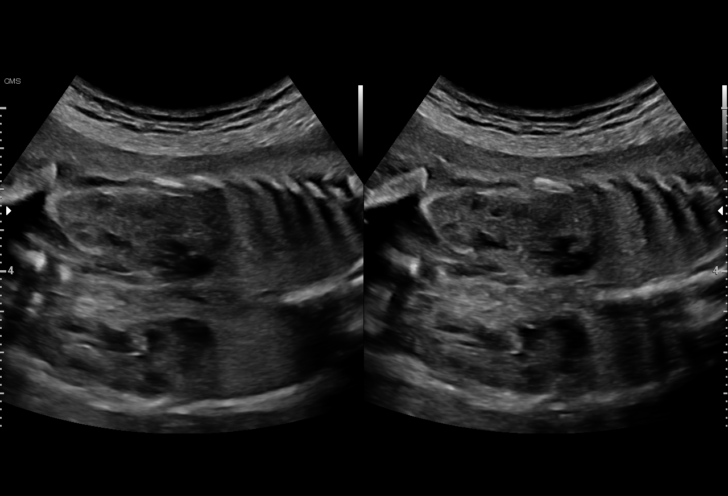
[im 41/59]
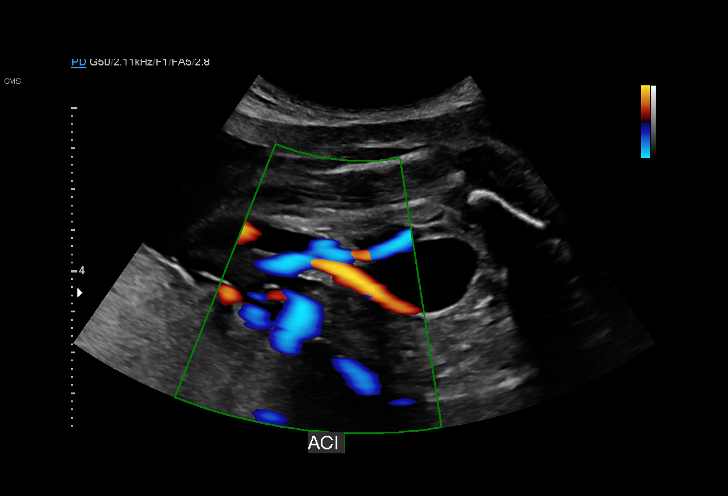
[im 46/59]
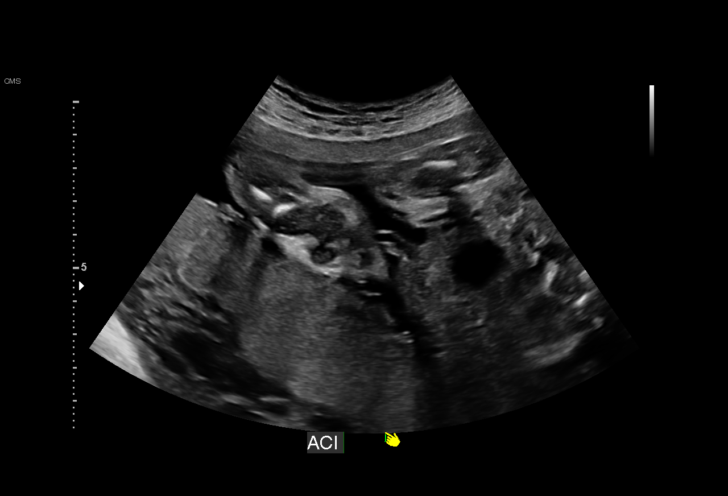
[im 50/59]
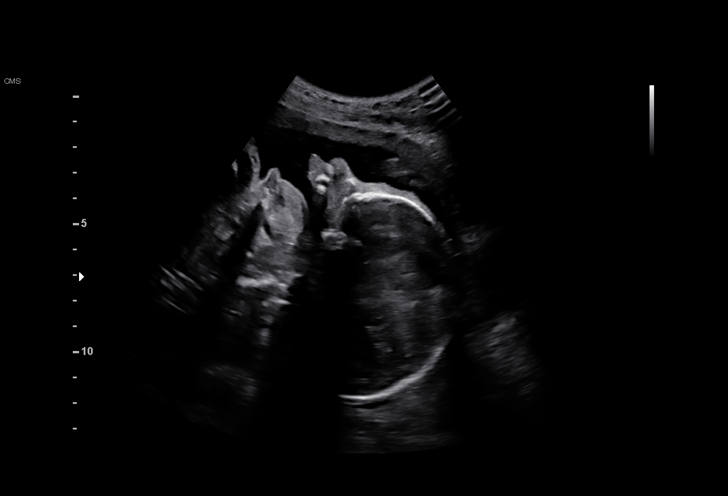
[im 54/59]
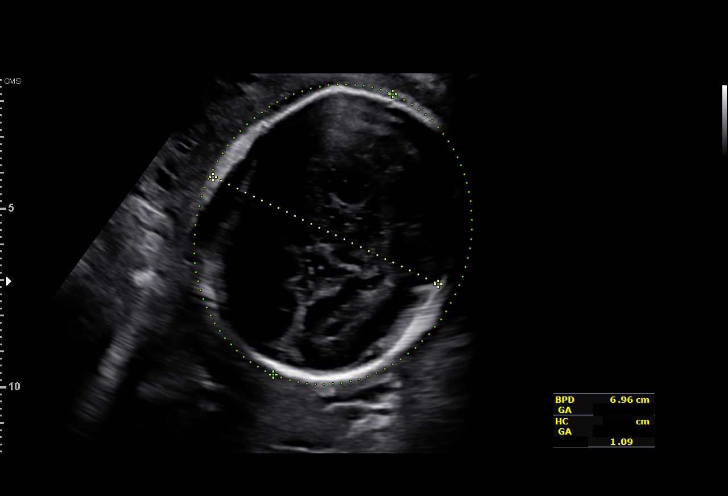
[im 59/59]
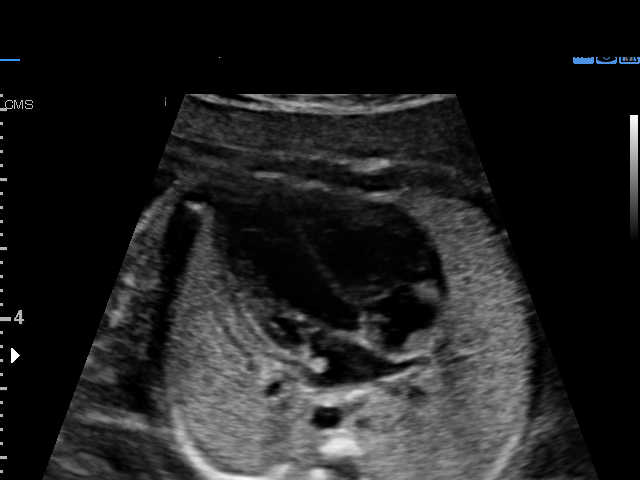

[14 of 28 positions shown; findings below may reference images not displayed]

OB/Gyn Clinic

1  LAIS DEO             [id number]      [id number]     [id number]
Indications

27 weeks gestation of pregnancy
Antenatal follow-up for nonvisualized fetal
anatomy
OB History

Gravidity:    4         Term:   2        Prem:   0         SAB:   1
TOP:          0       Ectopic:  0        Living: 2
Fetal Evaluation

Num Of Fetuses:     1
Fetal Heart         131
Rate(bpm):
Cardiac Activity:   Observed
Presentation:       Cephalic
Placenta:           Posterior, above cervical os
P. Cord Insertion:  Visualized, central

Amniotic Fluid
AFI FV:      Subjectively within normal limits

Largest Pocket(cm)
4.48
Biometry
BPD:      69.6  mm     G. Age:  28w 0d         67  %    CI:         75.76  %    70 - 86
FL/HC:       19.6  %    18.6 -
HC:      253.5  mm     G. Age:  27w 4d         35  %    HC/AC:       1.10       1.05 -
AC:      229.6  mm     G. Age:  27w 3d         48  %    FL/BPD:      71.6  %    71 - 87
FL:       49.8  mm     G. Age:  26w 6d         26  %    FL/AC:       21.7  %    20 - 24
HUM:      45.7  mm     G. Age:  27w 0d         43  %

Est. FW:    1328   gm     2 lb 5 oz     54  %
Gestational Age

U/S Today:     27w 3d                                        EDD:    06/13/16
Best:          27w 1d     Det. By:  Early Ultrasound         EDD:    06/15/16
(11/09/15)
Anatomy

Cranium:               Appears normal         Aortic Arch:            Previously seen
Cavum:                 Appears normal         Ductal Arch:            Previously seen
Ventricles:            Appears normal         Diaphragm:              Appears normal
Choroid Plexus:        Appears normal         Stomach:                Appears normal, left
sided
Cerebellum:            Previously seen        Abdomen:                Appears normal
Posterior Fossa:       Previously seen        Abdominal Wall:         Appears nml (cord
insert, abd wall)
Nuchal Fold:           Previously seen        Cord Vessels:           Appears normal (3
vessel cord)
Face:                  Appears normal         Kidneys:                Appear normal
(orbits and profile)
Lips:                  Appears normal         Bladder:                Appears normal
Thoracic:              Appears normal         Spine:                  Previously seen
Heart:                 Appears normal         Upper Extremities:      Previously seen
(4CH, axis, and situs
RVOT:                  Appears normal         Lower Extremities:      Previously seen
LVOT:                  Appears normal

Other:  Fetus appears to be a female. Heels and 5th digit previously
visualized. Nasal bone visualized. Technically difficult due to fetal
position.
Cervix Uterus Adnexa

Cervix
Length:            3.6  cm.
Normal appearance by transabdominal scan.

Uterus
No abnormality visualized.

Left Ovary
Not visualized.

Right Ovary
Within normal limits.

Cul De Sac:   No free fluid seen.

Adnexa:       No abnormality visualized.
Impression

SIUP at 27+1 weeks
Normal interval anatomy; anatomic survey complete
Normal amniotic fluid volume
Appropriate interval growth with EFW at the 54th %tile
Recommendations

Follow-up as clinically indicated

## 2020-06-05 ENCOUNTER — Encounter: Payer: Self-pay | Admitting: *Deleted
